# Patient Record
Sex: Female | Born: 1995 | Race: Black or African American | Hispanic: No | Marital: Married | Smoking: Current some day smoker
Health system: Southern US, Community
[De-identification: ages and names within clinical notes are randomized; demographics above are authoritative.]

## PROBLEM LIST (undated history)

## (undated) DIAGNOSIS — J45909 Unspecified asthma, uncomplicated: Secondary | ICD-10-CM

## (undated) DIAGNOSIS — L732 Hidradenitis suppurativa: Secondary | ICD-10-CM

## (undated) DIAGNOSIS — K219 Gastro-esophageal reflux disease without esophagitis: Secondary | ICD-10-CM

## (undated) DIAGNOSIS — I10 Essential (primary) hypertension: Secondary | ICD-10-CM

## (undated) DIAGNOSIS — G43909 Migraine, unspecified, not intractable, without status migrainosus: Secondary | ICD-10-CM

## (undated) HISTORY — DX: Hidradenitis suppurativa: L73.2

## (undated) HISTORY — PX: OTHER SURGICAL HISTORY: SHX169

## (undated) HISTORY — PX: TONSILLECTOMY: SUR1361

---

## 2016-03-14 ENCOUNTER — Encounter (HOSPITAL_COMMUNITY): Payer: Self-pay | Admitting: Family Medicine

## 2016-03-14 ENCOUNTER — Emergency Department (HOSPITAL_COMMUNITY)
Admission: EM | Admit: 2016-03-14 | Discharge: 2016-03-14 | Disposition: A | Discharge status: Home / Self Care | Payer: Medicaid Other | Attending: Emergency Medicine | Admitting: Emergency Medicine

## 2016-03-14 DIAGNOSIS — Z79899 Other long term (current) drug therapy: Secondary | ICD-10-CM | POA: Insufficient documentation

## 2016-03-14 DIAGNOSIS — R112 Nausea with vomiting, unspecified: Secondary | ICD-10-CM | POA: Diagnosis not present

## 2016-03-14 DIAGNOSIS — R1010 Upper abdominal pain, unspecified: Secondary | ICD-10-CM | POA: Diagnosis present

## 2016-03-14 DIAGNOSIS — J45909 Unspecified asthma, uncomplicated: Secondary | ICD-10-CM | POA: Diagnosis not present

## 2016-03-14 HISTORY — DX: Migraine, unspecified, not intractable, without status migrainosus: G43.909

## 2016-03-14 HISTORY — DX: Gastro-esophageal reflux disease without esophagitis: K21.9

## 2016-03-14 HISTORY — DX: Unspecified asthma, uncomplicated: J45.909

## 2016-03-14 LAB — COMPREHENSIVE METABOLIC PANEL
ALBUMIN: 3.9 g/dL (ref 3.5–5.0)
ALK PHOS: 61 U/L (ref 38–126)
ALT: 19 U/L (ref 14–54)
ANION GAP: 7 (ref 5–15)
AST: 22 U/L (ref 15–41)
BILIRUBIN TOTAL: 0.4 mg/dL (ref 0.3–1.2)
BUN: 8 mg/dL (ref 6–20)
CALCIUM: 9.1 mg/dL (ref 8.9–10.3)
CO2: 24 mmol/L (ref 22–32)
Chloride: 106 mmol/L (ref 101–111)
Creatinine, Ser: 0.84 mg/dL (ref 0.44–1.00)
GFR calc non Af Amer: >60 mL/min (ref >60)
GLUCOSE: 99 mg/dL (ref 65–99)
POTASSIUM: 3.9 mmol/L (ref 3.5–5.1)
Sodium: 137 mmol/L (ref 135–145)
TOTAL PROTEIN: 7 g/dL (ref 6.5–8.1)

## 2016-03-14 LAB — CBC
HEMATOCRIT: 42.4 % (ref 36.0–46.0)
HEMOGLOBIN: 14.2 g/dL (ref 12.0–15.0)
MCH: 31.3 pg (ref 26.0–34.0)
MCHC: 33.5 g/dL (ref 30.0–36.0)
MCV: 93.4 fL (ref 78.0–100.0)
Platelets: 254 10*3/uL (ref 150–400)
RBC: 4.54 MIL/uL (ref 3.87–5.11)
RDW: 13.1 % (ref 11.5–15.5)
WBC: 5.8 10*3/uL (ref 4.0–10.5)

## 2016-03-14 LAB — URINALYSIS, ROUTINE W REFLEX MICROSCOPIC
BILIRUBIN URINE: NEGATIVE
Glucose, UA: NEGATIVE mg/dL
HGB URINE DIPSTICK: NEGATIVE
KETONES UR: NEGATIVE mg/dL
NITRITE: NEGATIVE
PH: 5.5 (ref 5.0–8.0)
PROTEIN: NEGATIVE mg/dL
SPECIFIC GRAVITY, URINE: 1.027 (ref 1.005–1.030)

## 2016-03-14 LAB — URINE MICROSCOPIC-ADD ON

## 2016-03-14 LAB — I-STAT BETA HCG BLOOD, ED (MC, WL, AP ONLY)

## 2016-03-14 LAB — LIPASE, BLOOD: Lipase: 33 U/L (ref 11–51)

## 2016-03-14 MED ORDER — ONDANSETRON HCL 4 MG/2ML IJ SOLN
4.0000 mg | Freq: Once | INTRAMUSCULAR | Status: AC
  Administered 2016-03-14: 4 mg via INTRAVENOUS
  Filled 2016-03-14: qty 2

## 2016-03-14 MED ORDER — ONDANSETRON HCL 4 MG/2ML IJ SOLN: 4.0000 mg | Freq: Once | INTRAMUSCULAR | Status: DC

## 2016-03-14 MED ORDER — MORPHINE SULFATE (PF) 4 MG/ML IV SOLN
4.0000 mg | Freq: Once | INTRAVENOUS | Status: AC
  Administered 2016-03-14: 4 mg via INTRAVENOUS
  Filled 2016-03-14: qty 1

## 2016-03-14 MED ORDER — ONDANSETRON HCL 4 MG PO TABS
4.0000 mg | ORAL_TABLET | Freq: Four times a day (QID) | ORAL | Status: DC
Start: 2016-03-14 — End: 2016-07-16

## 2016-03-14 MED ORDER — SODIUM CHLORIDE 0.9 % IV BOLUS (SEPSIS)
1000.0000 mL | Freq: Once | INTRAVENOUS | Status: AC
  Administered 2016-03-14: 1000 mL via INTRAVENOUS

## 2016-03-14 NOTE — ED Provider Notes (Signed)
CSN: [id number]     Arrival date & time 03/14/16  1516 History   First MD Initiated Contact with Patient 03/14/16 1952     Chief Complaint  Patient presents with  . Abdominal Pain     (Consider location/radiation/quality/duration/timing/severity/associated sxs/prior Treatment) HPI Comments: Patient presents to the emergency department with chief complaint of upper abdominal pain. She reports associated nausea, and vomiting. She states that she thinks she ate some bad [place] food yesterday. She states that her pain is crampy.  She denies any fevers or chills. She states that she has some pain that radiates into her chest and throat. There are no other associated symptoms. There are no modifying factors.  The history is provided by the patient. No language interpreter was used.    Past Medical History  Diagnosis Date  . Asthma   . Acid reflux   . Migraines    Past Surgical History  Procedure Laterality Date  . Tonsillectomy     History reviewed. No pertinent family history. Social History  Substance Use Topics  . Smoking status: Never Smoker   . Smokeless tobacco: None  . Alcohol Use: No   OB History    No data available     Review of Systems  Constitutional: Negative for fever and chills.  Respiratory: Negative for shortness of breath.   Cardiovascular: Negative for chest pain.  Gastrointestinal: Positive for nausea and vomiting. Negative for diarrhea and constipation.  Genitourinary: Negative for dysuria.  All other systems reviewed and are negative.     Allergies  Review of patient's allergies indicates no known allergies.  Home Medications   Prior to Admission medications   Medication Sig Start Date End Date Taking? Authorizing Provider  albuterol (PROAIR HFA) 108 (90 Base) MCG/ACT inhaler Inhale 2 puffs into the lungs every 6 (six) hours as needed for wheezing or shortness of breath (FOR EXERCISE-INDUCED ASTHMA).   Yes Historical Provider, MD  naproxen  sodium (ANAPROX) 550 MG tablet Take 550 mg by mouth 2 (two) times daily as needed.    Historical Provider, MD  topiramate (TOPAMAX) 25 MG tablet Take 50 mg by mouth at bedtime.    Historical Provider, MD   BP 121/72 mmHg  Pulse 74  Temp(Src) 98.9 F (37.2 C)  Resp 22  SpO2 99% Physical Exam  Constitutional: She is oriented to person, place, and time. She appears well-developed and well-nourished.  HENT:  Head: Normocephalic and atraumatic.  Eyes: Conjunctivae and EOM are normal. Pupils are equal, round, and reactive to light.  Neck: Normal range of motion. Neck supple.  Cardiovascular: Normal rate and regular rhythm.  Exam reveals no gallop and no friction rub.   No murmur heard. Pulmonary/Chest: Effort normal and breath sounds normal. No respiratory distress. She has no wheezes. She has no rales. She exhibits no tenderness.  Abdominal: Soft. Bowel sounds are normal. She exhibits no distension and no mass. There is no tenderness. There is no rebound and no guarding.  No focal abdominal tenderness, no RLQ tenderness or pain at McBurney's point, no RUQ tenderness or Murphy's sign, no left-sided abdominal tenderness, no fluid wave, or signs of peritonitis   Musculoskeletal: Normal range of motion. She exhibits no edema or tenderness.  Neurological: She is alert and oriented to person, place, and time.  Skin: Skin is warm and dry.  Psychiatric: She has a normal mood and affect. Her behavior is normal. Judgment and thought content normal.  Nursing note and vitals reviewed.   ED Course  Procedures (including critical care time) Results for orders placed or performed during the hospital encounter of 03/14/16  Lipase, blood  Result Value Ref Range   Lipase 33 11 - 51 U/L  Comprehensive metabolic panel  Result Value Ref Range   Sodium 137 135 - 145 mmol/L   Potassium 3.9 3.5 - 5.1 mmol/L   Chloride 106 101 - 111 mmol/L   CO2 24 22 - 32 mmol/L   Glucose, Bld 99 65 - 99 mg/dL   BUN 8 6  - 20 mg/dL   Creatinine, Ser 0.270.84 0.44 - 1.00 mg/dL   Calcium 9.1 8.9 - 25.310.3 mg/dL   Total Protein 7.0 6.5 - 8.1 g/dL   Albumin 3.9 3.5 - 5.0 g/dL   AST 22 15 - 41 U/L   ALT 19 14 - 54 U/L   Alkaline Phosphatase 61 38 - 126 U/L   Total Bilirubin 0.4 0.3 - 1.2 mg/dL   GFR calc non Af Amer >60 >60 mL/min   GFR calc Af Amer >60 >60 mL/min   Anion gap 7 5 - 15  CBC  Result Value Ref Range   WBC 5.8 4.0 - 10.5 K/uL   RBC 4.54 3.87 - 5.11 MIL/uL   Hemoglobin 14.2 12.0 - 15.0 g/dL   HCT 66.442.4 40.336.0 - 47.446.0 %   MCV 93.4 78.0 - 100.0 fL   MCH 31.3 26.0 - 34.0 pg   MCHC 33.5 30.0 - 36.0 g/dL   RDW 25.913.1 56.311.5 - 87.515.5 %   Platelets 254 150 - 400 K/uL  Urinalysis, Routine w reflex microscopic  Result Value Ref Range   Color, Urine YELLOW YELLOW   APPearance CLOUDY (A) CLEAR   Specific Gravity, Urine 1.027 1.005 - 1.030   pH 5.5 5.0 - 8.0   Glucose, UA NEGATIVE NEGATIVE mg/dL   Hgb urine dipstick NEGATIVE NEGATIVE   Bilirubin Urine NEGATIVE NEGATIVE   Ketones, ur NEGATIVE NEGATIVE mg/dL   Protein, ur NEGATIVE NEGATIVE mg/dL   Nitrite NEGATIVE NEGATIVE   Leukocytes, UA SMALL (A) NEGATIVE  Urine microscopic-add on  Result Value Ref Range   Squamous Epithelial / LPF 0-5 (A) NONE SEEN   WBC, UA 0-5 0 - 5 WBC/hpf   RBC / HPF 0-5 0 - 5 RBC/hpf   Bacteria, UA MANY (A) NONE SEEN   Urine-Other MUCOUS PRESENT   I-Stat beta hCG blood, ED  Result Value Ref Range   I-stat hCG, quantitative <5.0 <5 mIU/mL   Comment 3           No results found.  I have personally reviewed and evaluated these images and lab results as part of my medical decision-making.   EKG Interpretation None      MDM   Final diagnoses:  Non-intractable vomiting with nausea, vomiting of unspecified type    Patient with one episode of nausea and vomiting today. She has had some crampy abdominal pain since eating [place] food yesterday. Denies any fevers. There there is no focal abdominal tenderness on exam. Doubt  surgical or acute abdomen. Vital signs are stable. Laboratory workup is unremarkable. Will treat symptoms and plan for discharge to home.  11:06 PM Patient states that she is feeling better. Will discharge to home with Zofran. No focal tenderness on exam. Labs and vitals are reassuring.    Roxy HorsemanRobert Azriel Jakob, PA-C 03/14/16 [id number]  Azalia BilisKevin Campos, MD 03/15/16 0111

## 2016-03-14 NOTE — ED Notes (Signed)
Pt here for upper abd pain across abdomen and N,V since yesterday.

## 2016-03-14 NOTE — Discharge Instructions (Signed)
Nausea and Vomiting °Nausea is a sick feeling that often comes before throwing up (vomiting). Vomiting is a reflex where stomach contents come out of your mouth. Vomiting can cause severe loss of body fluids (dehydration). Children and elderly adults can become dehydrated quickly, especially if they also have diarrhea. Nausea and vomiting are symptoms of a condition or disease. It is important to find the cause of your symptoms. °CAUSES  °· Direct irritation of the stomach lining. This irritation can result from increased acid production (gastroesophageal reflux disease), infection, food poisoning, taking certain medicines (such as nonsteroidal anti-inflammatory drugs), alcohol use, or tobacco use. °· Signals from the brain. These signals could be caused by a headache, heat exposure, an inner ear disturbance, increased pressure in the brain from injury, infection, a tumor, or a concussion, pain, emotional stimulus, or metabolic problems. °· An obstruction in the gastrointestinal tract (bowel obstruction). °· Illnesses such as diabetes, hepatitis, gallbladder problems, appendicitis, kidney problems, cancer, sepsis, atypical symptoms of a heart attack, or eating disorders. °· Medical treatments such as chemotherapy and radiation. °· Receiving medicine that makes you sleep (general anesthetic) during surgery. °DIAGNOSIS °Your caregiver may ask for tests to be done if the problems do not improve after a few days. Tests may also be done if symptoms are severe or if the reason for the nausea and vomiting is not clear. Tests may include: °· Urine tests. °· Blood tests. °· Stool tests. °· Cultures (to look for evidence of infection). °· X-rays or other imaging studies. °Test results can help your caregiver make decisions about treatment or the need for additional tests. °TREATMENT °You need to stay well hydrated. Drink frequently but in small amounts. You may wish to drink water, sports drinks, clear broth, or eat frozen  ice pops or gelatin dessert to help stay hydrated. When you eat, eating slowly may help prevent nausea. There are also some antinausea medicines that may help prevent nausea. °HOME CARE INSTRUCTIONS  °· Take all medicine as directed by your caregiver. °· If you do not have an appetite, do not force yourself to eat. However, you must continue to drink fluids. °· If you have an appetite, eat a normal diet unless your caregiver tells you differently. °· Eat a variety of complex carbohydrates (rice, wheat, potatoes, bread), lean meats, yogurt, fruits, and vegetables. °· Avoid high-fat foods because they are more difficult to digest. °· Drink enough water and fluids to keep your urine clear or pale yellow. °· If you are dehydrated, ask your caregiver for specific rehydration instructions. Signs of dehydration may include: °· Severe thirst. °· Dry lips and mouth. °· Dizziness. °· Dark urine. °· Decreasing urine frequency and amount. °· Confusion. °· Rapid breathing or pulse. °SEEK IMMEDIATE MEDICAL CARE IF:  °· You have blood or brown flecks (like coffee grounds) in your vomit. °· You have black or bloody stools. °· You have a severe headache or stiff neck. °· You are confused. °· You have severe abdominal pain. °· You have chest pain or trouble breathing. °· You do not urinate at least once every 8 hours. °· You develop cold or clammy skin. °· You continue to vomit for longer than 24 to 48 hours. °· You have a fever. °MAKE SURE YOU:  °· Understand these instructions. °· Will watch your condition. °· Will get help right away if you are not doing well or get worse. °  °This information is not intended to replace advice given to you by your health care provider. Make sure   you discuss any questions you have with your health care provider. °  °Document Released: 09/09/2005 Document Revised: 12/02/2011 Document Reviewed: 02/06/2011 °Elsevier Interactive Patient Education ©2016 [employer] ° °Abdominal Pain, Adult °Many  things can cause abdominal pain. Usually, abdominal pain is not caused by a disease and will improve without treatment. It can often be observed and treated at home. Your health care provider will do a physical exam and possibly order blood tests and X-rays to help determine the seriousness of your pain. However, in many cases, more time must pass before a clear cause of the pain can be found. Before that point, your health care provider may not know if you need more testing or further treatment. °HOME CARE INSTRUCTIONS °Monitor your abdominal pain for any changes. The following actions may help to alleviate any discomfort you are experiencing: °· Only take over-the-counter or prescription medicines as directed by your health care provider. °· Do not take laxatives unless directed to do so by your health care provider. °· Try a clear liquid diet (broth, tea, or water) as directed by your health care provider. Slowly move to a bland diet as tolerated. °SEEK MEDICAL CARE IF: °· You have unexplained abdominal pain. °· You have abdominal pain associated with nausea or diarrhea. °· You have pain when you urinate or have a bowel movement. °· You experience abdominal pain that wakes you in the night. °· You have abdominal pain that is worsened or improved by eating food. °· You have abdominal pain that is worsened with eating fatty foods. °· You have a fever. °SEEK IMMEDIATE MEDICAL CARE IF: °· Your pain does not go away within 2 hours. °· You keep throwing up (vomiting). °· Your pain is felt only in portions of the abdomen, such as the right side or the left lower portion of the abdomen. °· You pass bloody or black tarry stools. °MAKE SURE YOU: °· Understand these instructions. °· Will watch your condition. °· Will get help right away if you are not doing well or get worse. °  °This information is not intended to replace advice given to you by your health care provider. Make sure you discuss any questions you have with  your health care provider. °  °Document Released: 06/19/2005 Document Revised: 05/31/2015 Document Reviewed: 05/19/2013 °Elsevier Interactive Patient Education ©2016 [employer]vier Inc. ° °

## 2016-05-30 ENCOUNTER — Encounter (HOSPITAL_COMMUNITY): Payer: Self-pay | Admitting: Emergency Medicine

## 2016-05-30 DIAGNOSIS — R600 Localized edema: Secondary | ICD-10-CM | POA: Insufficient documentation

## 2016-05-30 DIAGNOSIS — Z7951 Long term (current) use of inhaled steroids: Secondary | ICD-10-CM | POA: Diagnosis not present

## 2016-05-30 DIAGNOSIS — F17200 Nicotine dependence, unspecified, uncomplicated: Secondary | ICD-10-CM | POA: Diagnosis not present

## 2016-05-30 DIAGNOSIS — Z79899 Other long term (current) drug therapy: Secondary | ICD-10-CM | POA: Diagnosis not present

## 2016-05-30 DIAGNOSIS — J45909 Unspecified asthma, uncomplicated: Secondary | ICD-10-CM | POA: Diagnosis not present

## 2016-05-30 DIAGNOSIS — R2242 Localized swelling, mass and lump, left lower limb: Secondary | ICD-10-CM | POA: Diagnosis present

## 2016-05-30 NOTE — ED Triage Notes (Signed)
Pt states for the past week or so she has been having swelling in her lower legs from the knees down  Pt [facility] and was [facility] for further evaluation  Pt states he legs ache but the pain increases with ambulation

## 2016-05-31 ENCOUNTER — Emergency Department (HOSPITAL_COMMUNITY)
Admission: EM | Admit: 2016-05-31 | Discharge: 2016-05-31 | Disposition: A | Discharge status: Home / Self Care | Payer: Medicaid Other | Attending: Emergency Medicine | Admitting: Emergency Medicine

## 2016-05-31 DIAGNOSIS — R600 Localized edema: Secondary | ICD-10-CM

## 2016-05-31 LAB — I-STAT CHEM 8, ED
BUN: 12 mg/dL (ref 6–20)
CALCIUM ION: 1.19 mmol/L (ref 1.15–1.40)
CHLORIDE: 102 mmol/L (ref 101–111)
Creatinine, Ser: 1 mg/dL (ref 0.44–1.00)
Glucose, Bld: 95 mg/dL (ref 65–99)
HCT: 42 % (ref 36.0–46.0)
HEMOGLOBIN: 14.3 g/dL (ref 12.0–15.0)
POTASSIUM: 3.8 mmol/L (ref 3.5–5.1)
SODIUM: 141 mmol/L (ref 135–145)
TCO2: 26 mmol/L (ref 0–100)

## 2016-05-31 LAB — I-STAT BETA HCG BLOOD, ED (MC, WL, AP ONLY)

## 2016-05-31 NOTE — Discharge Instructions (Signed)
Elevate your legs above your heart!  Take 4 over the counter ibuprofen tablets 3 times a day or 2 over-the-counter naproxen tablets twice a day for pain. Also take tylenol 1000mg (2 extra strength) four times a day.

## 2016-05-31 NOTE — ED Provider Notes (Signed)
[facility]-EMERGENCY DEPT Provider Note   CSN: [id number] Arrival date & time: 05/30/16  2009 By signing my name below, I, Levon Hedger, attest that this documentation has been prepared under the direction and in the presence of Melene Plan, DO . Electronically Signed: Levon Hedger, Scribe. 05/31/2016. 1:15 AM.   History   Chief Complaint Chief Complaint  Patient presents with  . Leg Swelling    HPI Frances Jones is a 20 y.o. female who presents to the Emergency Department complaining of moderate bilateral leg swelling onset one week ago. She also notes associated aching leg pain. Per pt, pain is exacerbated by ambulation. 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 Pt is otherwise in her normal health and denies any recent illness or injury. She denies any hx of kidney or liver problems.  Pt denies SOB or difficulties laying flat.   The history is provided by the patient. No language interpreter was used.    Past Medical History:  Diagnosis Date  . Acid reflux   . Asthma   . Migraines     There are no active problems to display for this patient.   Past Surgical History:  Procedure Laterality Date  . TONSILLECTOMY      OB History    No data available       Home Medications    Prior to Admission medications   Medication Sig Start Date End Date Taking? Authorizing Provider  albuterol (PROAIR HFA) 108 (90 Base) MCG/ACT inhaler Inhale 2 puffs into the lungs every 6 (six) hours as needed for wheezing or shortness of breath (FOR EXERCISE-INDUCED ASTHMA).    Historical Provider, MD  naproxen sodium (ANAPROX) 550 MG tablet Take 550 mg by mouth 2 (two) times daily as needed.    Historical Provider, MD  ondansetron (ZOFRAN) 4 MG tablet Take 1 tablet (4 mg total) by mouth every 6 (six) hours. 03/14/16   Roxy Horseman, PA-C  topiramate (TOPAMAX) 25 MG tablet Take 50 mg by mouth at bedtime.     Historical Provider, MD    Family History Family History  Problem Relation Age of Onset  . Diabetes Mother   . Cancer Other   . Stroke Other     Social History Social History  Substance Use Topics  . Smoking status: Current Some Day Smoker  . Smokeless tobacco: Never Used  . Alcohol use Yes     Allergies   Review of patient's allergies indicates no known allergies.   Review of Systems Review of Systems  Constitutional: Negative for chills and fever.  HENT: Negative for congestion and rhinorrhea.   Eyes: Negative for redness and visual disturbance.  Respiratory: Negative for shortness of breath and wheezing.   Cardiovascular: Positive for leg swelling. Negative for chest pain and palpitations.  Gastrointestinal: Negative for nausea and vomiting.  Genitourinary: Negative for dysuria and urgency.  Musculoskeletal: Positive for myalgias. Negative for arthralgias.  Skin: Negative for pallor and wound.  Neurological: Negative for dizziness and headaches.    Physical Exam Updated Vital Signs BP 124/55 (BP Location: Left Arm)   Pulse 72   Temp 98.3 F (36.8 C) (Oral)   Resp 15   SpO2 99%   Physical Exam  Constitutional: She is oriented to person, place, and time. She appears well-developed and well-nourished. No distress.  HENT:  Head: Normocephalic and atraumatic.  Eyes: EOM are normal. Pupils are equal, round, and  reactive to light.  Neck: Normal range of motion. Neck supple.  Cardiovascular: Normal rate, regular rhythm and normal heart sounds.  Exam reveals no gallop and no friction rub.   No murmur heard. Pulmonary/Chest: Effort normal. She has no wheezes. She has no rales.  Abdominal: Soft. She exhibits no distension. There is no tenderness.  Musculoskeletal: She exhibits no edema or tenderness.  +1 pitting edema up to the knees bilaterally  Neurological: She is alert and oriented to person, place, and time.  Skin: Skin is warm and dry. She is not diaphoretic.   Psychiatric: She has a normal mood and affect. Her behavior is normal.  Nursing note and vitals reviewed.  ED Treatments / Results  DIAGNOSTIC STUDIES:  Oxygen Saturation is 99% on RA, normal by my interpretation.    COORDINATION OF CARE:  1:13 AM Discussed treatment plan with pt at bedside and pt agreed to plan.   Labs (all labs ordered are listed, but only abnormal results are displayed) Labs Reviewed  I-STAT CHEM 8, ED  I-STAT BETA HCG BLOOD, ED ([facility], [facility], [facility] ONLY)    EKG  EKG Interpretation None       Radiology No results found.  Procedures Procedures (including critical care time)  Medications Ordered in ED Medications - No data to display   Initial Impression / Assessment and Plan / ED Course  I have reviewed the triage vital signs and the nursing notes.  Pertinent labs & imaging results that were available during my care of the patient were reviewed by me and considered in my medical decision making (see chart for details).  Clinical Course    20 yo F With a chief complaint of bilateral leg swelling. Going on for the past week or so. Patient has just started clinicals and so she's been on her feet more often than normal. Recommended leg elevation. PCP follow-up.  1:32 AM:  I have discussed the diagnosis/risks/treatment options with the patient and family and believe the pt to be eligible for discharge home to follow-up with PCP. We also discussed returning to the ED immediately if new or worsening sx occur. We discussed the sx which are most concerning (e.g., sudden worsening swelling, sob, syncope) that necessitate immediate return. Medications administered to the patient during their visit and any new prescriptions provided to the patient are listed below.  Medications given during this visit Medications - No data to display   The patient appears reasonably screen and/or stabilized for discharge and I doubt any other medical condition or other [facility]  requiring further screening, evaluation, or treatment in the ED at this time prior to discharge.    Final Clinical Impressions(s) / ED Diagnoses   Final diagnoses:  Bilateral edema of lower extremity   I personally performed the services described in this documentation, which was scribed in my presence. The recorded information has been reviewed and is accurate.    New Prescriptions New Prescriptions   No medications on file     Melene PlanDan Arabia Nylund, DO 05/31/16 0132

## 2016-07-05 ENCOUNTER — Encounter (HOSPITAL_COMMUNITY): Payer: Self-pay | Admitting: Emergency Medicine

## 2016-07-05 ENCOUNTER — Ambulatory Visit (HOSPITAL_COMMUNITY)
Admission: EM | Admit: 2016-07-05 | Discharge: 2016-07-05 | Disposition: A | Discharge status: Home / Self Care | Payer: Medicaid Other | Attending: Emergency Medicine | Admitting: Emergency Medicine

## 2016-07-05 DIAGNOSIS — N938 Other specified abnormal uterine and vaginal bleeding: Secondary | ICD-10-CM | POA: Diagnosis not present

## 2016-07-05 DIAGNOSIS — R600 Localized edema: Secondary | ICD-10-CM

## 2016-07-05 DIAGNOSIS — M791 Myalgia, unspecified site: Secondary | ICD-10-CM

## 2016-07-05 LAB — POCT URINALYSIS DIP (DEVICE)
BILIRUBIN URINE: NEGATIVE
GLUCOSE, UA: NEGATIVE mg/dL
KETONES UR: NEGATIVE mg/dL
LEUKOCYTES UA: NEGATIVE
Nitrite: NEGATIVE
Protein, ur: NEGATIVE mg/dL
Urobilinogen, UA: 0.2 mg/dL (ref 0.0–1.0)
pH: 6 (ref 5.0–8.0)

## 2016-07-05 LAB — POCT I-STAT, CHEM 8
BUN: 6 mg/dL (ref 6–20)
CHLORIDE: 105 mmol/L (ref 101–111)
Calcium, Ion: 1.2 mmol/L (ref 1.15–1.40)
Creatinine, Ser: 1 mg/dL (ref 0.44–1.00)
GLUCOSE: 92 mg/dL (ref 65–99)
HEMATOCRIT: 41 % (ref 36.0–46.0)
Hemoglobin: 13.9 g/dL (ref 12.0–15.0)
POTASSIUM: 4 mmol/L (ref 3.5–5.1)
SODIUM: 141 mmol/L (ref 135–145)
TCO2: 25 mmol/L (ref 0–100)

## 2016-07-05 LAB — POCT PREGNANCY, URINE: PREG TEST UR: NEGATIVE

## 2016-07-05 NOTE — Discharge Instructions (Signed)
Your labs were normal. Please follow up with PCP regarding peripheral edema of legs/dysfunctional uterine bleeding, student health or obtain local PCP. Wearing compression hose/stockings will help with symptoms, elevating lower extremities will help, reduce salt in diet. Go to ER for new or worsening symptoms. Return to UC as needed.

## 2016-07-05 NOTE — ED Triage Notes (Signed)
Pt here for bilateral pedal edema onset 1 month +++   Reports she's been to PCP, [facility], and [facility] for similar sx  Sx today include: pain and swelling... Increases w/activity  Reports she was exercising before coming here  A&O x4... NAD

## 2016-07-05 NOTE — ED Provider Notes (Signed)
CSN: [id number]     Arrival date & time 07/05/16  1518 History   None    Chief Complaint  Patient presents with  . Leg Swelling   (Consider location/radiation/quality/duration/timing/severity/associated sxs/prior Treatment) 20 yr old AA female presents to Er with CC of bilateral leg swellingx >1 year, has been seen by PCP, ER, Orthopedist for same advised peripheral edema and follow up with PCP. Pt staets she is in school and PCP is 2 hours away. Pt also reports myalgia of legs and irregular vaginal bleeding since pedal edema and implant hormone therapy/Birth control. Pt denies any trauma or injury. No treatmetn PTA. Pt is nursing student on feet a lot.    The history is provided by the patient.    Past Medical History:  Diagnosis Date  . Acid reflux   . Asthma   . Migraines    Past Surgical History:  Procedure Laterality Date  . TONSILLECTOMY     Family History  Problem Relation Age of Onset  . Diabetes Mother   . Cancer Other   . Stroke Other    Social History  Substance Use Topics  . Smoking status: Current Some Day Smoker  . Smokeless tobacco: Never Used  . Alcohol use Yes   OB History    No data available     Review of Systems  Constitutional: Negative for fever.  HENT: Negative.   Eyes: Negative.   Respiratory: Negative.  Negative for chest tightness and shortness of breath.   Cardiovascular: Positive for leg swelling. Negative for chest pain and palpitations.  Gastrointestinal: Negative.   Endocrine: Negative.   Genitourinary: Negative.   Musculoskeletal: Positive for myalgias.  Skin: Negative for color change, pallor, rash and wound.  Allergic/Immunologic: Negative.   Neurological: Negative.   Hematological: Negative.   Psychiatric/Behavioral: Negative.   All other systems reviewed and are negative.   Allergies  Review of patient's allergies indicates no known allergies.  Home Medications   Prior to Admission medications   Medication Sig Start  Date End Date Taking? Authorizing Provider  albuterol (PROAIR HFA) 108 (90 Base) MCG/ACT inhaler Inhale 2 puffs into the lungs every 6 (six) hours as needed for wheezing or shortness of breath (FOR EXERCISE-INDUCED ASTHMA).    Historical Provider, MD  naproxen sodium (ANAPROX) 550 MG tablet Take 550 mg by mouth 2 (two) times daily as needed.    Historical Provider, MD  ondansetron (ZOFRAN) 4 MG tablet Take 1 tablet (4 mg total) by mouth every 6 (six) hours. 03/14/16   Roxy Horseman, PA-C  topiramate (TOPAMAX) 25 MG tablet Take 50 mg by mouth at bedtime.    Historical Provider, MD   Meds Ordered and Administered this Visit  Medications - No data to display  BP 118/67 (BP Location: Left Arm)   Pulse (!) 55 Comment: notified rn  Temp 98.8 F (37.1 C) (Oral)   Resp 14   SpO2 100%  No data found.   Physical Exam  Constitutional: She is oriented to person, place, and time. She appears well-developed and well-nourished. She is active and cooperative.  Non-toxic appearance. She does not have a sickly appearance. She does not appear ill. No distress.  HENT:  Head: Normocephalic.  Eyes: Pupils are equal, round, and reactive to light.  Cardiovascular: Normal rate, regular rhythm and normal pulses.   Pulses:      Dorsalis pedis pulses are 2+ on the right side, and 2+ on the left side.  Pulmonary/Chest: Effort normal.  Abdominal: Normal  appearance.  Obese abdomen  Musculoskeletal: Normal range of motion. She exhibits edema and tenderness.  Bilateral +2 pedal edema R>L, + TTP of lower legs, good distal sensation and movement  Neurological: She is alert and oriented to person, place, and time. She has normal strength. No cranial nerve deficit. Gait normal. GCS eye subscore is 4. GCS verbal subscore is 5. GCS motor subscore is 6.  Skin: Skin is warm, dry and intact. Capillary refill takes 2 to 3 seconds. No rash noted.  Psychiatric: She has a normal mood and affect. Her speech is normal and  behavior is normal. Judgment and thought content normal.  Nursing note and vitals reviewed.   Urgent Care Course   Clinical Course    Procedures (including critical care time)  Labs Review Labs Reviewed  POCT URINALYSIS DIP (DEVICE) - Abnormal; Notable for the following:       Result Value   Hgb urine dipstick MODERATE (*)    All other components within normal limits  POCT I-STAT, CHEM 8  POCT PREGNANCY, URINE    Imaging Review No results found.   Recent Results (from the past 2160 hour(s))  I-Stat Beta hCG blood, ED ([facility], [facility], [facility] only)     Status: None   Collection Time: 05/31/16 12:56 AM  Result Value Ref Range   I-stat hCG, quantitative <5.0 <5 mIU/mL   Comment 3            Comment:   GEST. AGE      CONC.  (mIU/mL)   <=1 WEEK        5 - 50     2 WEEKS       50 - 500     3 WEEKS       100 - 10,000     4 WEEKS     1,000 - 30,000        FEMALE AND NON-PREGNANT FEMALE:     LESS THAN 5 mIU/mL   I-Stat Chem 8, ED     Status: None   Collection Time: 05/31/16 12:58 AM  Result Value Ref Range   Sodium 141 135 - 145 mmol/L   Potassium 3.8 3.5 - 5.1 mmol/L   Chloride 102 101 - 111 mmol/L   BUN 12 6 - 20 mg/dL   Creatinine, Ser 1.611.00 0.44 - 1.00 mg/dL   Glucose, Bld 95 65 - 99 mg/dL   Calcium, Ion 0.961.19 0.451.15 - 1.40 mmol/L   TCO2 26 0 - 100 mmol/L   Hemoglobin 14.3 12.0 - 15.0 g/dL   HCT 40.942.0 81.136.0 - 91.446.0 %  POCT urinalysis dip (device)     Status: Abnormal   Collection Time: 07/05/16  5:32 PM  Result Value Ref Range   Glucose, UA NEGATIVE NEGATIVE mg/dL   Bilirubin Urine NEGATIVE NEGATIVE   Ketones, ur NEGATIVE NEGATIVE mg/dL   Specific Gravity, Urine <=1.005 1.005 - 1.030   Hgb urine dipstick MODERATE (A) NEGATIVE   pH 6.0 5.0 - 8.0   Protein, ur NEGATIVE NEGATIVE mg/dL   Urobilinogen, UA 0.2 0.0 - 1.0 mg/dL   Nitrite NEGATIVE NEGATIVE   Leukocytes, UA NEGATIVE NEGATIVE    Comment: Biochemical Testing Only. Please order routine urinalysis from main lab if  confirmatory testing is needed.  I-STAT, chem 8     Status: None   Collection Time: 07/05/16  5:37 PM  Result Value Ref Range   Sodium 141 135 - 145 mmol/L   Potassium 4.0 3.5 - 5.1 mmol/L  Chloride 105 101 - 111 mmol/L   BUN 6 6 - 20 mg/dL   Creatinine, Ser 1.61 0.44 - 1.00 mg/dL   Glucose, Bld 92 65 - 99 mg/dL   Calcium, Ion 0.96 1.15 - 1.40 mmol/L   TCO2 25 0 - 100 mmol/L   Hemoglobin 13.9 12.0 - 15.0 g/dL   HCT 04.5 40.9 - 81.1 %  Pregnancy, urine POC     Status: None   Collection Time: 07/05/16  5:39 PM  Result Value Ref Range   Preg Test, Ur NEGATIVE NEGATIVE    Comment:        THE SENSITIVITY OF THIS METHODOLOGY IS >24 mIU/mL        MDM   1. Bilateral lower extremity edema   2. Dysfunctional uterine bleeding   3. Myalgia    I stat, pregnancy, UA in UC.  Reviewed labs: Your labs were normal. Please follow up with PCP regarding peripheral edema of legs, student health or obtain local PCP. Wearing compression hose/stockings will help with symptoms, elevating lower extremities will help, reduce salt in diet. Go to ER for new or worsening symptoms. Return to UC as needed. Pt verbalized understanding to this provider.    Clancy Gourd, NP 07/05/16 [phone]

## 2016-07-15 ENCOUNTER — Encounter (HOSPITAL_COMMUNITY): Payer: Self-pay | Admitting: Emergency Medicine

## 2016-07-15 ENCOUNTER — Emergency Department (HOSPITAL_COMMUNITY): Payer: Medicaid Other

## 2016-07-15 ENCOUNTER — Other Ambulatory Visit: Payer: Self-pay

## 2016-07-15 ENCOUNTER — Emergency Department (HOSPITAL_COMMUNITY)
Admission: EM | Admit: 2016-07-15 | Discharge: 2016-07-16 | Disposition: A | Discharge status: Home / Self Care | Payer: Medicaid Other | Attending: Emergency Medicine | Admitting: Emergency Medicine

## 2016-07-15 DIAGNOSIS — R0789 Other chest pain: Secondary | ICD-10-CM | POA: Insufficient documentation

## 2016-07-15 DIAGNOSIS — J45909 Unspecified asthma, uncomplicated: Secondary | ICD-10-CM | POA: Insufficient documentation

## 2016-07-15 DIAGNOSIS — F17200 Nicotine dependence, unspecified, uncomplicated: Secondary | ICD-10-CM | POA: Insufficient documentation

## 2016-07-15 LAB — CBC
HCT: 42 % (ref 36.0–46.0)
HEMOGLOBIN: 13.8 g/dL (ref 12.0–15.0)
MCH: 31.1 pg (ref 26.0–34.0)
MCHC: 32.9 g/dL (ref 30.0–36.0)
MCV: 94.6 fL (ref 78.0–100.0)
Platelets: 305 10*3/uL (ref 150–400)
RBC: 4.44 MIL/uL (ref 3.87–5.11)
RDW: 12.8 % (ref 11.5–15.5)
WBC: 9.3 10*3/uL (ref 4.0–10.5)

## 2016-07-15 LAB — BASIC METABOLIC PANEL
ANION GAP: 6 (ref 5–15)
BUN: 7 mg/dL (ref 6–20)
CALCIUM: 9.1 mg/dL (ref 8.9–10.3)
CHLORIDE: 110 mmol/L (ref 101–111)
CO2: 25 mmol/L (ref 22–32)
Creatinine, Ser: 0.94 mg/dL (ref 0.44–1.00)
GFR calc non Af Amer: >60 mL/min (ref >60)
Glucose, Bld: 117 mg/dL — ABNORMAL HIGH (ref 65–99)
Potassium: 4.3 mmol/L (ref 3.5–5.1)
Sodium: 141 mmol/L (ref 135–145)

## 2016-07-15 LAB — I-STAT TROPONIN, ED: TROPONIN I, POC: 0 ng/mL (ref 0.00–0.08)

## 2016-07-15 NOTE — ED Triage Notes (Signed)
Patient reports pain across her chest onset this evening with occasional dry cough , denies SOB , no nausea or diaphoresis .

## 2016-07-16 MED ORDER — ALBUTEROL SULFATE (2.5 MG/3ML) 0.083% IN NEBU
5.0000 mg | INHALATION_SOLUTION | Freq: Once | RESPIRATORY_TRACT | Status: AC
  Administered 2016-07-16: 5 mg via RESPIRATORY_TRACT
  Filled 2016-07-16: qty 6

## 2016-07-16 MED ORDER — NAPROXEN 500 MG PO TABS: 500.0000 mg | ORAL_TABLET | Freq: Two times a day (BID) | ORAL | 0 refills | Status: DC

## 2016-07-16 MED ORDER — IPRATROPIUM BROMIDE 0.02 % IN SOLN
0.5000 mg | Freq: Once | RESPIRATORY_TRACT | Status: AC
  Administered 2016-07-16: 0.5 mg via RESPIRATORY_TRACT
  Filled 2016-07-16: qty 2.5

## 2016-07-16 NOTE — ED Provider Notes (Signed)
[facility]-EMERGENCY DEPT Provider Note   CSN: [id number] Arrival date & time: 07/15/16  2138     History   Chief Complaint Chief Complaint  Patient presents with  . Chest Pain    HPI Frances Jones is a 20 y.o. female.  Patient with a history of mild asthma presents with chest pain across mid-chest that started earlier this evening. She describes the pain as pressure. No SOB, nausea, vomiting, fever or cough. No URI symptoms. She does not feel this is secondary to asthma. No injury to chest wall. No alleviating or aggravating factors.    The history is provided by the patient. No language interpreter was used.    Past Medical History:  Diagnosis Date  . Acid reflux   . Asthma   . Migraines     There are no active problems to display for this patient.   Past Surgical History:  Procedure Laterality Date  . TONSILLECTOMY      OB History    No data available       Home Medications    Prior to Admission medications   Medication Sig Start Date End Date Taking? Authorizing Provider  albuterol (PROAIR HFA) 108 (90 Base) MCG/ACT inhaler Inhale 2 puffs into the lungs every 6 (six) hours as needed for wheezing or shortness of breath (FOR EXERCISE-INDUCED ASTHMA).   Yes Historical Provider, MD  ondansetron (ZOFRAN) 4 MG tablet Take 1 tablet (4 mg total) by mouth every 6 (six) hours. Patient not taking: Reported on 07/15/2016 03/14/16   Roxy Horsemanobert Browning, PA-C    Family History Family History  Problem Relation Age of Onset  . Diabetes Mother   . Cancer Other   . Stroke Other     Social History Social History  Substance Use Topics  . Smoking status: Current Some Day Smoker  . Smokeless tobacco: Never Used  . Alcohol use Yes     Allergies   Review of patient's allergies indicates no known allergies.   Review of Systems Review of Systems  Constitutional: Negative for chills and fever.  HENT: Negative.  Negative for congestion and sore throat.   Respiratory:  Negative.  Negative for cough and shortness of breath.   Cardiovascular: Positive for chest pain.  Gastrointestinal: Negative.  Negative for abdominal pain, nausea and vomiting.  Genitourinary: Negative.  Negative for dysuria.  Musculoskeletal: Negative.  Negative for myalgias.  Skin: Negative.   Neurological: Negative.  Negative for light-headedness.     Physical Exam Updated Vital Signs BP 148/97 (BP Location: Right Arm)   Pulse 99   Temp 98.2 F (36.8 C) (Oral)   Resp 22   Ht 5\' 7"  (1.702 m)   Wt 111.1 kg   SpO2 100%   BMI 38.37 kg/m   Physical Exam  Constitutional: She appears well-developed and well-nourished.  HENT:  Head: Normocephalic.  Neck: Normal range of motion. Neck supple.  Cardiovascular: Normal rate and regular rhythm.   No murmur heard. Pulmonary/Chest: Effort normal and breath sounds normal. She has no wheezes. She has no rales. She exhibits tenderness (Central chest reproducibly tender. ).  Abdominal: Soft. Bowel sounds are normal. There is no tenderness. There is no rebound and no guarding.  Musculoskeletal: Normal range of motion. She exhibits no edema.  Neurological: She is alert.  Skin: Skin is warm and dry. No rash noted.  Psychiatric: She has a normal mood and affect.     ED Treatments / Results  Labs (all labs ordered are listed, but only  abnormal results are displayed) Labs Reviewed  BASIC METABOLIC PANEL - Abnormal; Notable for the following:       Result Value   Glucose, Bld 117 (*)    All other components within normal limits  CBC  I-STAT TROPOININ, ED   Results for orders placed or performed during the hospital encounter of 07/15/16  Basic metabolic panel  Result Value Ref Range   Sodium 141 135 - 145 mmol/L   Potassium 4.3 3.5 - 5.1 mmol/L   Chloride 110 101 - 111 mmol/L   CO2 25 22 - 32 mmol/L   Glucose, Bld 117 (H) 65 - 99 mg/dL   BUN 7 6 - 20 mg/dL   Creatinine, Ser 1.61 0.44 - 1.00 mg/dL   Calcium 9.1 8.9 - 09.6 mg/dL    GFR calc non Af Amer >60 >60 mL/min   GFR calc Af Amer >60 >60 mL/min   Anion gap 6 5 - 15  CBC  Result Value Ref Range   WBC 9.3 4.0 - 10.5 K/uL   RBC 4.44 3.87 - 5.11 MIL/uL   Hemoglobin 13.8 12.0 - 15.0 g/dL   HCT 04.5 40.9 - 81.1 %   MCV 94.6 78.0 - 100.0 fL   MCH 31.1 26.0 - 34.0 pg   MCHC 32.9 30.0 - 36.0 g/dL   RDW 91.4 78.2 - 95.6 %   Platelets 305 150 - 400 K/uL  I-stat troponin, ED  Result Value Ref Range   Troponin i, poc 0.00 0.00 - 0.08 ng/mL   Comment 3            EKG  EKG Interpretation  Date/Time:  Monday July 15 2016 21:42:19 EDT Ventricular Rate:  86 PR Interval:  152 QRS Duration: 86 QT Interval:  360 QTC Calculation: 430 R Axis:   30 Text Interpretation:  Normal sinus rhythm with sinus arrhythmia Normal ECG No old tracing to compare Confirmed by [facility]  MD, DAVID ([id number]) on 07/16/2016 12:23:27 AM       Radiology Dg Chest 2 View  Result Date: 07/15/2016 CLINICAL DATA:  Severe central chest pain 30 minutes ago while eating. Dyspnea. Current smoker. EXAM: CHEST  2 VIEW COMPARISON:  None. FINDINGS: The heart size and mediastinal contours are within normal limits. Mild bilateral interstitial prominence which may reflect bronchitic change. The visualized skeletal structures are unremarkable. IMPRESSION: Mild increase in interstitial prominence which may reflect bronchitic change. Electronically Signed   By: Tollie Eth M.D.   On: 07/15/2016 23:06    Procedures Procedures (including critical care time)  Medications Ordered in ED Medications - No data to display   Initial Impression / Assessment and Plan / ED Course  I have reviewed the triage vital signs and the nursing notes.  Pertinent labs & imaging results that were available during my care of the patient were reviewed by me and considered in my medical decision making (see chart for details).  Clinical Course    Patient with a history of asthma presents with chest pain, unlike her asthma  flares. No fever. Chest reproducibly tender. CXR shows possible bronchitic changes. Will try a nebulizer treatment for symptomatic relief and re-evaluate.  She feels minimally improved after treatment. Pain is reproducible, better with certain positions. Labs negative for evidence of infection, cardiac origin. She can be discharged home with treatment for chest wall pain and follow up with PCP.  Final Clinical Impressions(s) / ED Diagnoses   Final diagnoses:  None   1. Chest wall pain  New Prescriptions New Prescriptions   No medications on file     Elpidio Anis, Cordelia Poche 07/16/16 0243    Dione Booze, MD 07/16/16 [phone]

## 2017-01-22 ENCOUNTER — Emergency Department (HOSPITAL_COMMUNITY)
Admission: EM | Admit: 2017-01-22 | Discharge: 2017-01-22 | Disposition: A | Discharge status: Home / Self Care | Payer: BLUE CROSS/BLUE SHIELD | Attending: Emergency Medicine | Admitting: Emergency Medicine

## 2017-01-22 ENCOUNTER — Emergency Department (HOSPITAL_COMMUNITY): Payer: BLUE CROSS/BLUE SHIELD

## 2017-01-22 ENCOUNTER — Encounter (HOSPITAL_COMMUNITY): Payer: Self-pay | Admitting: *Deleted

## 2017-01-22 DIAGNOSIS — Z79899 Other long term (current) drug therapy: Secondary | ICD-10-CM | POA: Diagnosis not present

## 2017-01-22 DIAGNOSIS — J45909 Unspecified asthma, uncomplicated: Secondary | ICD-10-CM | POA: Insufficient documentation

## 2017-01-22 DIAGNOSIS — F17200 Nicotine dependence, unspecified, uncomplicated: Secondary | ICD-10-CM | POA: Insufficient documentation

## 2017-01-22 DIAGNOSIS — M546 Pain in thoracic spine: Secondary | ICD-10-CM | POA: Diagnosis not present

## 2017-01-22 DIAGNOSIS — M545 Low back pain, unspecified: Secondary | ICD-10-CM

## 2017-01-22 LAB — URINALYSIS, ROUTINE W REFLEX MICROSCOPIC
BILIRUBIN URINE: NEGATIVE
GLUCOSE, UA: NEGATIVE mg/dL
Hgb urine dipstick: NEGATIVE
KETONES UR: NEGATIVE mg/dL
LEUKOCYTES UA: NEGATIVE
Nitrite: NEGATIVE
PROTEIN: NEGATIVE mg/dL
Specific Gravity, Urine: 1.017 (ref 1.005–1.030)
pH: 5 (ref 5.0–8.0)

## 2017-01-22 LAB — POC URINE PREG, ED: Preg Test, Ur: NEGATIVE

## 2017-01-22 MED ORDER — CYCLOBENZAPRINE HCL 10 MG PO TABS: 10.0000 mg | ORAL_TABLET | Freq: Three times a day (TID) | ORAL | 0 refills | Status: AC | PRN

## 2017-01-22 MED ORDER — KETOROLAC TROMETHAMINE 60 MG/2ML IM SOLN
30.0000 mg | Freq: Once | INTRAMUSCULAR | Status: AC
  Administered 2017-01-22: 30 mg via INTRAMUSCULAR
  Filled 2017-01-22: qty 2

## 2017-01-22 NOTE — ED Triage Notes (Signed)
Pt complains of intermittent mid-back pain since last night. Pt states pain started while she was sitting down. Pt denies injury. Pt states she does not lift heavy objects at work.

## 2017-01-22 NOTE — ED Provider Notes (Signed)
[facility]-EMERGENCY DEPT Provider Note    By signing my name below, I, Earmon Phoenix, attest that this documentation has been prepared under the direction and in the presence of Trinity [place], PA-C. Electronically Signed: Earmon Phoenix, ED Scribe. 01/22/17. 6:25 PM.    History   Chief Complaint Chief Complaint  Patient presents with  . Back Pain   The history is provided by the patient and medical records. No language interpreter was used.    Frances Jones is an obese 21 y.o. female who presents to the Emergency Department complaining of waxing and waning mid back pain that began while sitting at work last night. She reports associated nausea that has since resolved. She states the pain radiates to bilateral flanks and upper abdomen. She describes the pain as sharp and aching and rates it at 10/10 at its worst and 5-6/10 other times. She has taken [facility] Powder for pain with temporary relief. Palpating the area increases the pain. She denies alleviating factors. She denies fever, chills, diarrhea, vomiting, SOB, bowel or bladder incontinence, numbness, tingling or weakness of any extremity. She denies trauma, injury or fall. She denies h/o nephrolithiasis. She has Nexplanon for birth control and states her periods are abnormal.    Past Medical History:  Diagnosis Date  . Acid reflux   . Asthma   . Migraines     There are no active problems to display for this patient.   Past Surgical History:  Procedure Laterality Date  . TONSILLECTOMY      OB History    No data available       Home Medications    Prior to Admission medications   Medication Sig Start Date End Date Taking? Authorizing Provider  albuterol (PROAIR HFA) 108 (90 Base) MCG/ACT inhaler Inhale 2 puffs into the lungs every 6 (six) hours as needed for wheezing or shortness of breath (FOR EXERCISE-INDUCED ASTHMA).    Historical Provider, MD  cyclobenzaprine (FLEXERIL) 10 MG tablet Take 1 tablet (10 mg total) by mouth 3  (three) times daily as needed for muscle spasms. 01/22/17 01/25/17  Taejah Ohalloran A Gumaro Brightbill, PA-C  naproxen (NAPROSYN) 500 MG tablet Take 1 tablet (500 mg total) by mouth 2 (two) times daily. 07/16/16   Elpidio Anis, PA-C    Family History Family History  Problem Relation Age of Onset  . Diabetes Mother   . Cancer Other   . Stroke Other     Social History Social History  Substance Use Topics  . Smoking status: Current Some Day Smoker  . Smokeless tobacco: Never Used  . Alcohol use Yes     Allergies   Patient has no known allergies.   Review of Systems Review of Systems  Constitutional: Negative for chills and fever.  Respiratory: Negative for shortness of breath.   Gastrointestinal: Positive for abdominal pain and nausea. Negative for diarrhea and vomiting.       No bowel or bladder incontinence  Genitourinary: Positive for flank pain.  Musculoskeletal: Positive for back pain.  Neurological: Negative for weakness and numbness.     Physical Exam Updated Vital Signs BP 138/83 (BP Location: Right Arm)   Pulse 70   Temp 97.9 F (36.6 C) (Oral)   Resp 18   SpO2 100%   Physical Exam  Constitutional: She is oriented to person, place, and time. She appears well-developed and well-nourished. No distress.  HENT:  Head: Normocephalic and atraumatic.  Eyes: Conjunctivae are normal.  Neck: Normal range of motion. Neck supple. No JVD  present. No tracheal deviation present.  No midline CSP ttp, no paraspinal muscle spasm  Cardiovascular: Normal rate, regular rhythm, normal heart sounds and intact distal pulses.   No murmur heard. 2+ radial and DP/PT pulses bl, negative Homan's bl   Pulmonary/Chest: Effort normal and breath sounds normal. No respiratory distress. She exhibits no tenderness.  Abdominal: Soft. Bowel sounds are normal. She exhibits no distension. There is no tenderness.  Genitourinary:  Genitourinary Comments: No CVA tenderness  Musculoskeletal: Normal range of motion.  She exhibits tenderness. She exhibits no edema.  LSP ttp around L3/4, some paraspinal muscle ttp Lateral lower ribs ttp L > R.  Full ROM of lumbar spine, pain with flexion but not extension or lateral motion. 5/5 strength of BUEand BLE  Neurological: She is alert and oriented to person, place, and time. No sensory deficit.  Fluent speech, no facial droop, sensation intact globally, normal gait, and patient able to heel walk and toe walk without difficulty.   Skin: Skin is warm and dry. Capillary refill takes less than 2 seconds. No rash noted.  Psychiatric: She has a normal mood and affect. Her behavior is normal.  Nursing note and vitals reviewed.    ED Treatments / Results  DIAGNOSTIC STUDIES: Oxygen Saturation is 100% on RA, normal by my interpretation.   COORDINATION OF CARE: 5:29 PM- Will order injection of Toradol and CXR prior to discharge. Pt verbalizes understanding and agrees to plan.  Medications  ketorolac (TORADOL) injection 30 mg (30 mg Intramuscular Given 01/22/17 1750)    Labs (all labs ordered are listed, but only abnormal results are displayed) Labs Reviewed  URINALYSIS, ROUTINE W REFLEX MICROSCOPIC  POC URINE PREG, ED    EKG  EKG Interpretation None       Radiology Dg Chest 2 View  Result Date: 01/22/2017 CLINICAL DATA:  Posterior low back pain for 1 day, initial encounter EXAM: CHEST  2 VIEW COMPARISON:  07/15/2016 FINDINGS: The heart size and mediastinal contours are within normal limits. Both lungs are clear. The visualized skeletal structures are unremarkable. IMPRESSION: No active cardiopulmonary disease. Electronically Signed   By: Alcide Clever M.D.   On: 01/22/2017 18:18    Procedures Procedures (including critical care time)  Medications Ordered in ED Medications  ketorolac (TORADOL) injection 30 mg (30 mg Intramuscular Given 01/22/17 1750)     Initial Impression / Assessment and Plan / ED Course  I have reviewed the triage vital signs and  the nursing notes.  Pertinent labs & imaging results that were available during my care of the patient were reviewed by me and considered in my medical decision making (see chart for details).     Patient with acute onset intermittent mid back pain with intermittent radiation to the flanks. No known trauma. Patient afebrile, vital signs stable. Abdominal examination unremarkable. UA normal. Low suspicion UTI, nephrolithiasis, pyelonephritis. Chest x-ray normal, no fractures. Toradol given in ED helpful for pain. Suspect pain is musculoskeletal in nature. Discussed symptomatic care with ibuprofen, Tylenol, ice, heat, gentle stretching, and muscle relaxer at night to aid with sleep. Patient will follow up with student health or primary care for reevaluation in 2-3 days. Discussed strict ED return precautions. Rx for Flexeril given. Pt verbalized understanding of and agreement with plan and is safe for discharge home at this time.   Final Clinical Impressions(s) / ED Diagnoses   Final diagnoses:  Acute bilateral low back pain without sciatica    New Prescriptions Discharge Medication List as of  01/22/2017  6:51 PM    START taking these medications   Details  cyclobenzaprine (FLEXERIL) 10 MG tablet Take 1 tablet (10 mg total) by mouth 3 (three) times daily as needed for muscle spasms., Starting Wed 01/22/2017, Until Sat 01/25/2017, Print       I personally performed the services described in this documentation, which was scribed in my presence. The recorded information has been reviewed and is accurate.    Jeanie Sewer, PA-C 01/22/17 1939    Doug Sou, MD 01/22/17 [phone]

## 2017-01-22 NOTE — Discharge Instructions (Signed)

## 2017-03-24 DIAGNOSIS — Z3046 Encounter for surveillance of implantable subdermal contraceptive: Secondary | ICD-10-CM | POA: Diagnosis not present

## 2017-05-06 DIAGNOSIS — Z23 Encounter for immunization: Secondary | ICD-10-CM | POA: Diagnosis not present

## 2017-07-18 ENCOUNTER — Emergency Department (HOSPITAL_BASED_OUTPATIENT_CLINIC_OR_DEPARTMENT_OTHER)
Admission: EM | Admit: 2017-07-18 | Discharge: 2017-07-18 | Disposition: A | Discharge status: Home / Self Care | Payer: BLUE CROSS/BLUE SHIELD | Attending: Emergency Medicine | Admitting: Emergency Medicine

## 2017-07-18 ENCOUNTER — Encounter (HOSPITAL_BASED_OUTPATIENT_CLINIC_OR_DEPARTMENT_OTHER): Payer: Self-pay

## 2017-07-18 DIAGNOSIS — M5489 Other dorsalgia: Secondary | ICD-10-CM | POA: Diagnosis not present

## 2017-07-18 DIAGNOSIS — M545 Low back pain, unspecified: Secondary | ICD-10-CM

## 2017-07-18 DIAGNOSIS — J45909 Unspecified asthma, uncomplicated: Secondary | ICD-10-CM | POA: Diagnosis not present

## 2017-07-18 DIAGNOSIS — F17290 Nicotine dependence, other tobacco product, uncomplicated: Secondary | ICD-10-CM | POA: Diagnosis not present

## 2017-07-18 MED ORDER — METHOCARBAMOL 500 MG PO TABS
1000.0000 mg | ORAL_TABLET | Freq: Once | ORAL | Status: AC
  Administered 2017-07-18: 1000 mg via ORAL
  Filled 2017-07-18: qty 2

## 2017-07-18 MED ORDER — CYCLOBENZAPRINE HCL 10 MG PO TABS: 10.0000 mg | ORAL_TABLET | Freq: Two times a day (BID) | ORAL | 0 refills | Status: DC | PRN

## 2017-07-18 MED ORDER — PANTOPRAZOLE SODIUM 20 MG PO TBEC: 20.0000 mg | DELAYED_RELEASE_TABLET | Freq: Every day | ORAL | 0 refills | Status: DC

## 2017-07-18 MED ORDER — NAPROXEN 500 MG PO TABS: 500.0000 mg | ORAL_TABLET | Freq: Two times a day (BID) | ORAL | 0 refills | Status: DC

## 2017-07-18 MED ORDER — KETOROLAC TROMETHAMINE 60 MG/2ML IM SOLN
60.0000 mg | Freq: Once | INTRAMUSCULAR | Status: AC
  Administered 2017-07-18: 60 mg via INTRAMUSCULAR
  Filled 2017-07-18: qty 2

## 2017-07-18 NOTE — ED Notes (Signed)
ED Provider at bedside. 

## 2017-07-18 NOTE — ED Notes (Signed)
Pt discharged to home with family. NAD. Steady slow gait.

## 2017-07-18 NOTE — ED Triage Notes (Signed)
C/o pain to mid/upper back x "months"-denies injury-slow steady gait

## 2017-07-18 NOTE — Discharge Instructions (Signed)
Please read and follow all provided instructions.  Your diagnoses today include:  1. Acute bilateral low back pain without sciatica     Tests performed today include:  Vital signs - see below for your results today  Medications prescribed:   Naproxen - anti-inflammatory pain medication  Do not exceed 500mg  naproxen every 12 hours, take with food  You have been prescribed an anti-inflammatory medication or NSAID. Take with food. Take smallest effective dose for the shortest duration needed for your pain. Stop taking if you experience stomach pain or vomiting.    Pantoprazole - stomach acid reducer    Flexeril (cyclobenzaprine) - muscle relaxer medication  DO NOT drive or perform any activities that require you to be awake and alert because this medicine can make you drowsy.   This medication can be found over-the-counter  Take any prescribed medications only as directed.  Home care instructions:   Follow any educational materials contained in this packet  Please rest, use ice or heat on your back for the next several days  Do not lift, push, pull anything more than 10 pounds for the next week  Follow-up instructions: Please follow-up with your primary care provider when able for further evaluation of your symptoms.   Return instructions:  SEEK IMMEDIATE MEDICAL ATTENTION IF YOU HAVE:  New numbness, tingling, weakness, or problem with the use of your arms or legs  Severe back pain not relieved with medications  Loss control of your bowels or bladder  Increasing pain in any areas of the body (such as chest or abdominal pain)  Shortness of breath, dizziness, or fainting.   Worsening nausea (feeling sick to your stomach), vomiting, fever, or sweats  Any other emergent concerns regarding your health   Additional Information:  Your vital signs today were: BP (!) 142/78 (BP Location: Left Arm)    Pulse (!) 58    Temp 98 F (36.7 C) (Oral)    Resp 18    Ht 5\' 7"   (1.702 m)    Wt 126.1 kg (278 lb)    LMP 07/07/2017    SpO2 98%    BMI 43.54 kg/m  If your blood pressure (BP) was elevated above 135/85 this visit, please have this repeated by your doctor within one month. --------------

## 2017-07-18 NOTE — ED Provider Notes (Signed)
[facility] EMERGENCY DEPARTMENT Provider Note   CSN: [id number] Arrival date & time: 07/18/17  1617     History   Chief Complaint Chief Complaint  Patient presents with  . Back Pain    HPI Frances Jones is a 21 y.o. female.  Patient with history of lower back pain for approximately 5 months, intermittent --presents with acute on chronic bilateral lower back pain starting in the past day.  Patient describes pain across her upper lumbar spine, not involving the midline, that does not radiate.  Pain is worse with twisting and bending.  She denies acute injury.  Patient is a Psychologist, sport and exercise.  She has a primary care physician appointment scheduled for next month.  She has not had any evaluation outside of an x-ray.  She has been treated with muscle relaxers in the past which have helped her sleep.  She also has a history of acid reflux and took Tums this for this this morning.  She had some abdominal pain at onset that is now resolved. Patient denies warning symptoms of back pain including: fecal incontinence, urinary retention or overflow incontinence, night sweats, waking from sleep with back pain, unexplained fevers or weight loss, h/o cancer, IVDU, recent trauma. Sent from [facility] for consideration of CT. No hematuria, unilateral flank pain or history of kidney stones.        Past Medical History:  Diagnosis Date  . Acid reflux   . Asthma   . Migraines     There are no active problems to display for this patient.   Past Surgical History:  Procedure Laterality Date  . TONSILLECTOMY      OB History    No data available       Home Medications    Prior to Admission medications   Not on File    Family History Family History  Problem Relation Age of Onset  . Diabetes Mother   . Cancer Other   . Stroke Other     Social History Social History  Substance Use Topics  . Smoking status: Current Some Day Smoker    Types: Cigars  . Smokeless tobacco:  Never Used  . Alcohol use Yes     Comment: occ     Allergies   Patient has no known allergies.   Review of Systems Review of Systems  Constitutional: Negative for fever and unexpected weight change.  Gastrointestinal: Negative for constipation.       Negative for fecal incontinence.   Genitourinary: Negative for dysuria, flank pain, hematuria, pelvic pain, vaginal bleeding and vaginal discharge.       Negative for urinary incontinence or retention.  Musculoskeletal: Positive for back pain.  Neurological: Negative for weakness and numbness.       Denies saddle paresthesias.     Physical Exam Updated Vital Signs BP (!) 142/78 (BP Location: Left Arm)   Pulse (!) 58   Temp 98 F (36.7 C) (Oral)   Resp 18   Ht 5\' 7"  (1.702 m)   Wt 126.1 kg (278 lb)   LMP 07/07/2017   SpO2 98%   BMI 43.54 kg/m   Physical Exam  Constitutional: She appears well-developed and well-nourished.  HENT:  Head: Normocephalic and atraumatic.  Eyes: Conjunctivae are normal.  Neck: Normal range of motion. Neck supple.  Pulmonary/Chest: Effort normal.  Abdominal: Soft. There is no tenderness. There is no CVA tenderness.  Musculoskeletal: Normal range of motion.       Right hip: Normal.  Left hip: Normal.       Right knee: Normal.       Left knee: Normal.       Cervical back: She exhibits normal range of motion, no tenderness and no bony tenderness.       Thoracic back: She exhibits normal range of motion, no tenderness and no bony tenderness.       Lumbar back: She exhibits tenderness. She exhibits normal range of motion and no bony tenderness.       Back:  No step-off noted with palpation of spine.   Neurological: She is alert. She has normal strength and normal reflexes. No sensory deficit.  5/5 strength in entire lower extremities bilaterally. No sensation deficit.   Skin: Skin is warm and dry. No rash noted.  Psychiatric: She has a normal mood and affect.  Nursing note and vitals  reviewed.    ED Treatments / Results  Labs (all labs ordered are listed, but only abnormal results are displayed) Labs Reviewed - No data to display  EKG  EKG Interpretation None       Radiology No results found.  Procedures Procedures (including critical care time)  Medications Ordered in ED Medications  ketorolac (TORADOL) injection 60 mg (60 mg Intramuscular Given 07/18/17 1712)  methocarbamol (ROBAXIN) tablet 1,000 mg (1,000 mg Oral Given 07/18/17 1712)     Initial Impression / Assessment and Plan / ED Course  I have reviewed the triage vital signs and the nursing notes.  Pertinent labs & imaging results that were available during my care of the patient were reviewed by me and considered in my medical decision making (see chart for details).     Patient seen and examined. Discussed need for CT imaging with Dr. Ranae PalmsYelverton.  Do not feel that CT would be warranted at this time.  Given persistent symptoms, patient needs to follow-up with primary care or neurosurgeon for consideration of MRI which would give us more information.  Patient has no red flag signs and symptoms of lower back pain to necessitate transfer for emergent MRI at this time.  Will treat with IM Toradol, muscle relaxer medication.  Vital signs reviewed and are as follows: BP (!) 142/78 (BP Location: Left Arm)   Pulse (!) 58   Temp 98 F (36.7 C) (Oral)   Resp 18   Ht 5\' 7"  (1.702 m)   Wt 126.1 kg (278 lb)   LMP 07/07/2017   SpO2 98%   BMI 43.54 kg/m    5:51 PM Pt stable.  We will discharge to the inflammatories, muscle relaxer.  Will give neurosurgery referral if desired.  Encourage PCP follow-up.  Encourage stretching when her back is improved.  No red flag s/s of low back pain. Patient was counseled on back pain precautions and told to do activity as tolerated but do not lift, push, or pull heavy objects more than 10 pounds for the next week.  Patient counseled to use ice or heat on back for no  longer than 15 minutes every hour.   Patient counseled on proper use of muscle relaxant medication.  They were told not to drink alcohol, drive any vehicle, or do any dangerous activities while taking this medication.  Patient verbalized understanding.  Patient urged to follow-up with PCP if pain does not improve with treatment and rest or if pain becomes recurrent. Urged to return with worsening severe pain, loss of bowel or bladder control, trouble walking.   The patient verbalizes understanding and agrees  with the plan.    Final Clinical Impressions(s) / ED Diagnoses   Final diagnoses:  Acute bilateral low back pain without sciatica   Patient with back pain without radicular features. No neurological deficits. Patient is ambulatory. No warning symptoms of back pain including: fecal incontinence, urinary retention or overflow incontinence, night sweats, waking from sleep with back pain, unexplained fevers or weight loss, h/o cancer, IVDU, recent trauma. No concern for cauda equina, epidural abscess, or other serious cause of back pain. Conservative measures such as rest, ice/heat and pain medicine indicated with PCP follow-up if no improvement with conservative management.  Do not feel that CT imaging, given no concern for metastatic cancer, trauma, would be beneficial today.  Discussed with patient.    New Prescriptions New Prescriptions   CYCLOBENZAPRINE (FLEXERIL) 10 MG TABLET    Take 1 tablet (10 mg total) by mouth 2 (two) times daily as needed for muscle spasms.   NAPROXEN (NAPROSYN) 500 MG TABLET    Take 1 tablet (500 mg total) by mouth 2 (two) times daily.   PANTOPRAZOLE (PROTONIX) 20 MG TABLET    Take 1 tablet (20 mg total) by mouth daily.     Renne Crigler, PA-C 07/18/17 1754    Loren Racer, MD 07/20/17 [phone]

## 2017-10-06 DIAGNOSIS — J4520 Mild intermittent asthma, uncomplicated: Secondary | ICD-10-CM | POA: Diagnosis not present

## 2017-10-06 DIAGNOSIS — M545 Low back pain: Secondary | ICD-10-CM | POA: Diagnosis not present

## 2017-10-06 DIAGNOSIS — Z8669 Personal history of other diseases of the nervous system and sense organs: Secondary | ICD-10-CM | POA: Diagnosis not present

## 2017-10-22 DIAGNOSIS — Z0000 Encounter for general adult medical examination without abnormal findings: Secondary | ICD-10-CM | POA: Diagnosis not present

## 2017-12-31 DIAGNOSIS — Z111 Encounter for screening for respiratory tuberculosis: Secondary | ICD-10-CM | POA: Diagnosis not present

## 2018-01-23 DIAGNOSIS — Z111 Encounter for screening for respiratory tuberculosis: Secondary | ICD-10-CM | POA: Diagnosis not present

## 2018-01-23 DIAGNOSIS — M5442 Lumbago with sciatica, left side: Secondary | ICD-10-CM | POA: Diagnosis not present

## 2018-06-24 DIAGNOSIS — R1010 Upper abdominal pain, unspecified: Secondary | ICD-10-CM | POA: Diagnosis not present

## 2018-06-26 ENCOUNTER — Other Ambulatory Visit: Payer: Self-pay | Admitting: Family Medicine

## 2018-06-26 DIAGNOSIS — K8020 Calculus of gallbladder without cholecystitis without obstruction: Secondary | ICD-10-CM

## 2018-06-26 DIAGNOSIS — R1010 Upper abdominal pain, unspecified: Secondary | ICD-10-CM

## 2018-07-06 ENCOUNTER — Ambulatory Visit
Admission: RE | Admit: 2018-07-06 | Discharge: 2018-07-06 | Disposition: A | Discharge status: Home / Self Care | Payer: BLUE CROSS/BLUE SHIELD | Source: Ambulatory Visit | Attending: Family Medicine | Admitting: Family Medicine

## 2018-07-06 DIAGNOSIS — K820 Obstruction of gallbladder: Secondary | ICD-10-CM | POA: Diagnosis not present

## 2018-07-06 DIAGNOSIS — K8020 Calculus of gallbladder without cholecystitis without obstruction: Secondary | ICD-10-CM | POA: Diagnosis not present

## 2018-07-06 DIAGNOSIS — R1010 Upper abdominal pain, unspecified: Secondary | ICD-10-CM

## 2018-08-10 ENCOUNTER — Other Ambulatory Visit: Payer: Self-pay | Admitting: General Surgery

## 2018-08-10 DIAGNOSIS — K8020 Calculus of gallbladder without cholecystitis without obstruction: Secondary | ICD-10-CM | POA: Diagnosis not present

## 2018-08-10 DIAGNOSIS — Z6841 Body Mass Index (BMI) 40.0 and over, adult: Secondary | ICD-10-CM | POA: Diagnosis not present

## 2018-08-10 DIAGNOSIS — K219 Gastro-esophageal reflux disease without esophagitis: Secondary | ICD-10-CM | POA: Diagnosis not present

## 2018-08-10 DIAGNOSIS — J45909 Unspecified asthma, uncomplicated: Secondary | ICD-10-CM | POA: Diagnosis not present

## 2018-10-07 DIAGNOSIS — Z111 Encounter for screening for respiratory tuberculosis: Secondary | ICD-10-CM | POA: Diagnosis not present

## 2019-09-01 IMAGING — US US ABDOMEN COMPLETE
1 series · 13 of 25 positions shown · non-contrast
Comparison: None.

CLINICAL DATA: Intermittent upper abdominal and epigastric pain
radiating to the back over the past year. Family history of
gallstones.

EXAM:
ABDOMEN ULTRASOUND COMPLETE

[Series 1: us abdomen complete · 0.22mm/px · 13 of 86 slices shown]
[im 1/86]
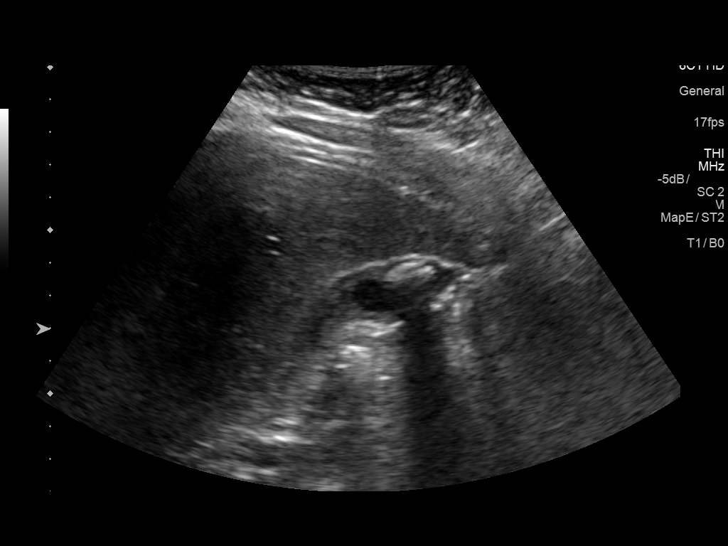
[im 8/86]
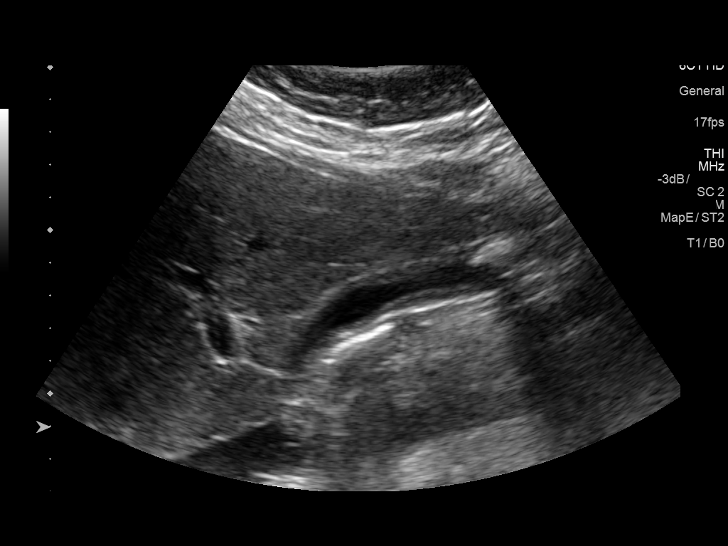
[im 15/86]
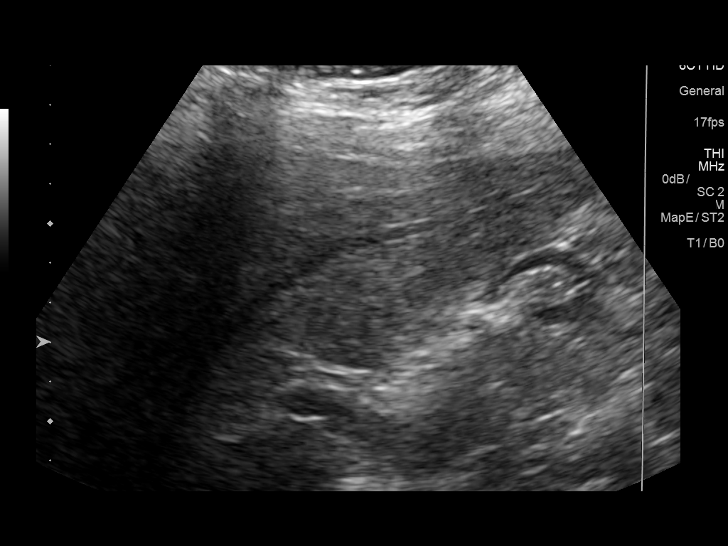
[im 22/86]
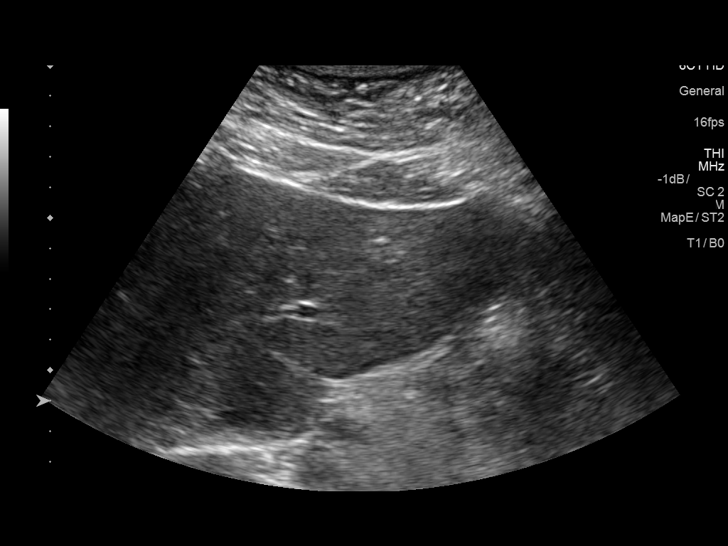
[im 29/86]
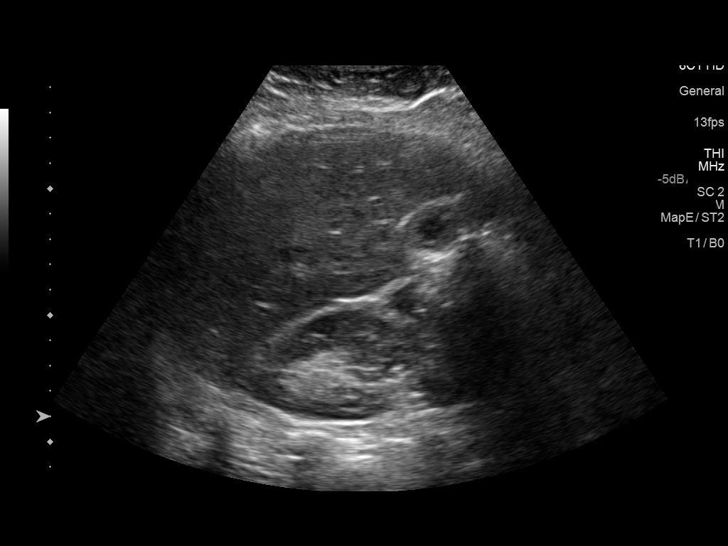
[im 36/86]
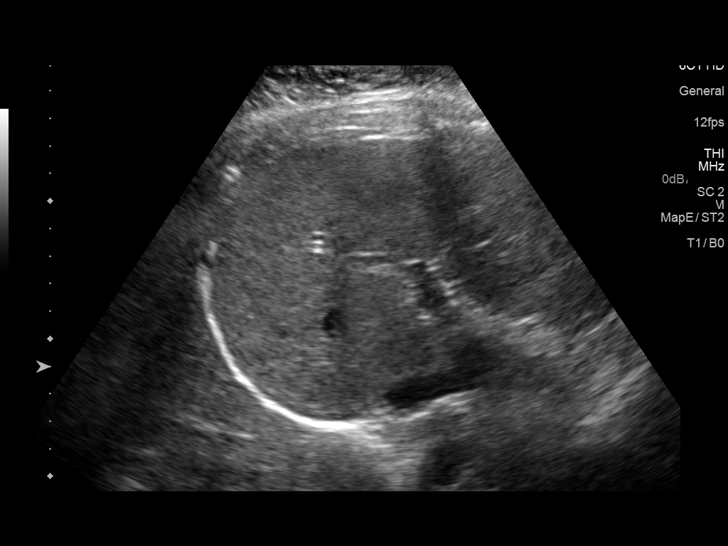
[im 43/86]
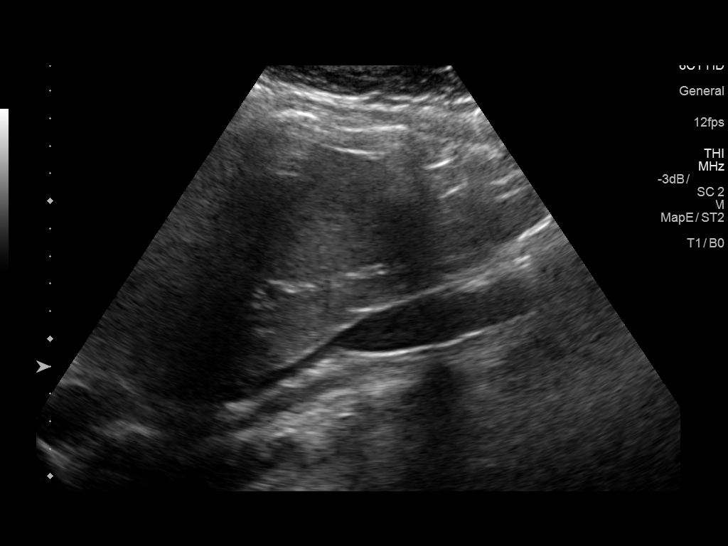
[im 50/86]
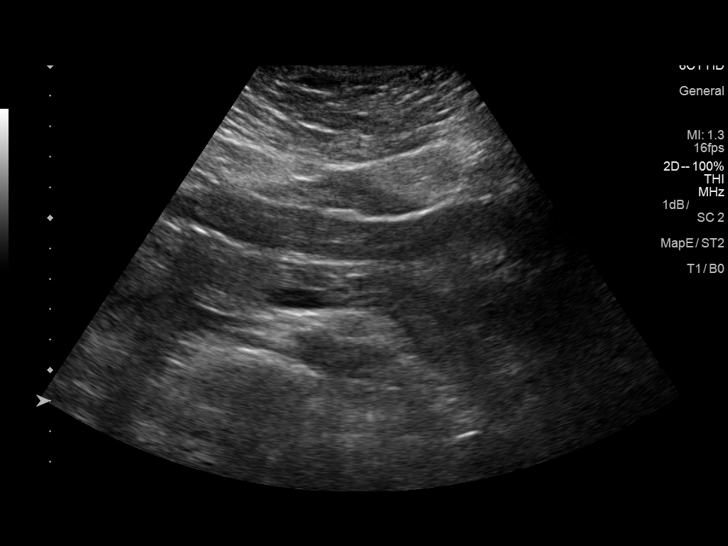
[im 57/86]
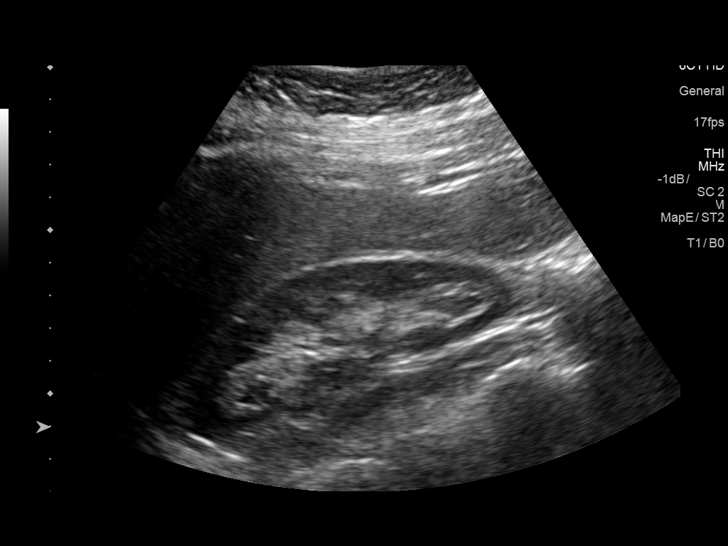
[im 64/86]
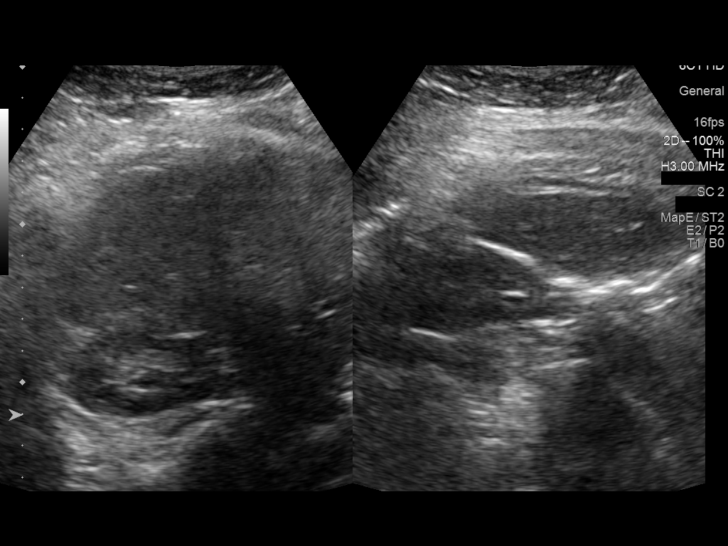
[im 71/86]
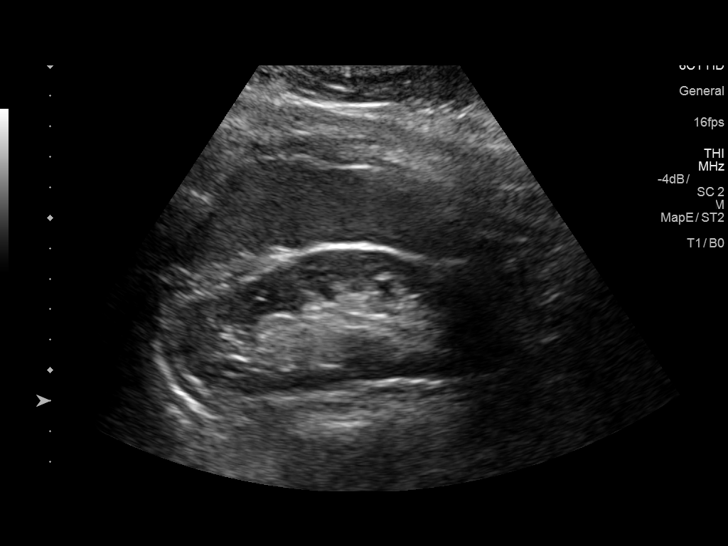
[im 78/86]
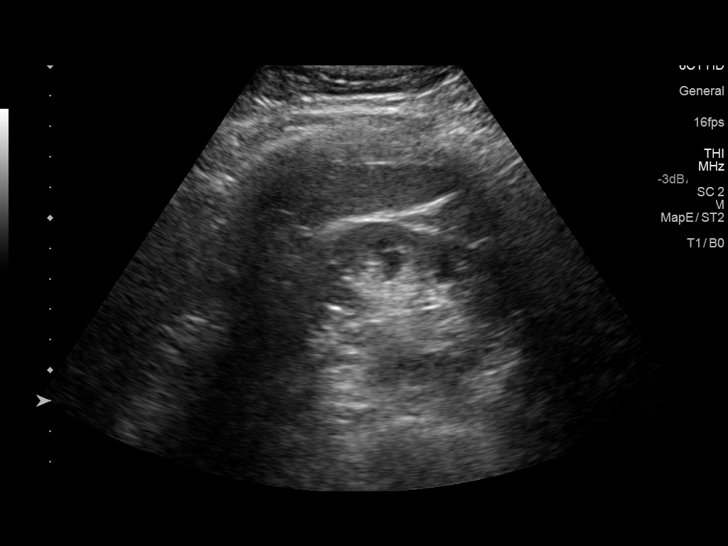
[im 86/86]
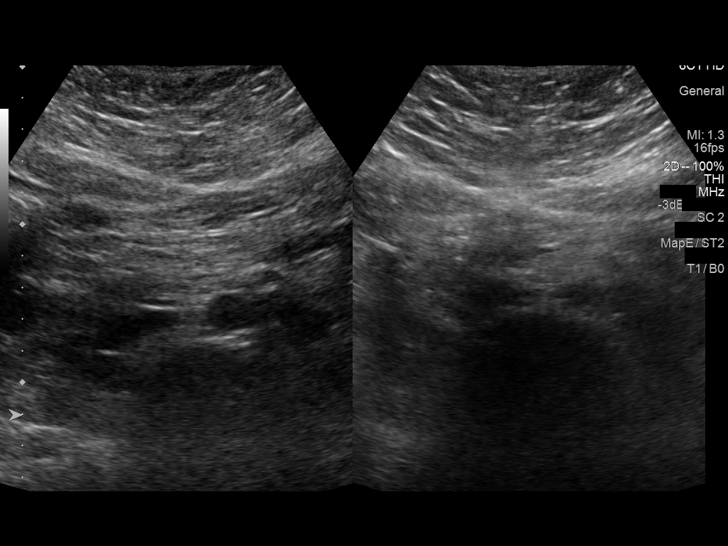

[13 of 25 positions shown; findings below may reference images not displayed]

FINDINGS: Gallbladder: The gallbladder is only partially distended. There is a
shadowing mobile non mobile gallstone measuring 15 mm in diameter in
the fundus. There is thickening of the gallbladder wall to 5 mm.
There is no pericholecystic fluid or positive sonographic Murphy's
sign.

Common bile duct: Diameter: 2.5 mm

Liver: No focal lesion identified. Within normal limits in
parenchymal echogenicity. Portal vein is patent on color Doppler
imaging with normal direction of blood flow towards the liver.

IVC: No abnormality visualized.

Pancreas: Visualized portion unremarkable.

Spleen: Size and appearance within normal limits.

Right Kidney: Length: 10.7 cm. Echogenicity within normal limits. No
mass or hydronephrosis visualized.

Left Kidney: Length: 11.2 cm. Echogenicity within normal limits. No
mass or hydronephrosis visualized.

Abdominal aorta: No aneurysm visualized.

Other findings: There is no ascites.
IMPRESSION: Contracted gallbladder exhibiting wall thickening and containing a
stone which appears impacted in the gallbladder neck. No positive
sonographic Murphy's sign.

No acute abnormality observed elsewhere within the abdomen.

## 2019-12-01 DIAGNOSIS — R634 Abnormal weight loss: Secondary | ICD-10-CM | POA: Diagnosis not present

## 2019-12-03 DIAGNOSIS — S4990XA Unspecified injury of shoulder and upper arm, unspecified arm, initial encounter: Secondary | ICD-10-CM | POA: Diagnosis not present

## 2019-12-03 DIAGNOSIS — S199XXA Unspecified injury of neck, initial encounter: Secondary | ICD-10-CM | POA: Diagnosis not present

## 2020-02-18 DIAGNOSIS — M25473 Effusion, unspecified ankle: Secondary | ICD-10-CM | POA: Diagnosis not present

## 2020-02-18 DIAGNOSIS — R609 Edema, unspecified: Secondary | ICD-10-CM | POA: Diagnosis not present

## 2020-03-10 DIAGNOSIS — H00019 Hordeolum externum unspecified eye, unspecified eyelid: Secondary | ICD-10-CM | POA: Diagnosis not present

## 2020-07-11 DIAGNOSIS — M2550 Pain in unspecified joint: Secondary | ICD-10-CM | POA: Diagnosis not present

## 2020-07-11 DIAGNOSIS — Z23 Encounter for immunization: Secondary | ICD-10-CM | POA: Diagnosis not present

## 2021-06-12 ENCOUNTER — Other Ambulatory Visit: Payer: Self-pay

## 2021-06-12 ENCOUNTER — Inpatient Hospital Stay (HOSPITAL_COMMUNITY)
Admission: EM | Admit: 2021-06-12 | Discharge: 2021-06-14 | DRG: 418 | Disposition: A | Discharge status: Home / Self Care | Payer: BC Managed Care – PPO | Attending: Surgery | Admitting: Surgery

## 2021-06-12 ENCOUNTER — Encounter (HOSPITAL_COMMUNITY): Payer: Self-pay | Admitting: *Deleted

## 2021-06-12 DIAGNOSIS — R001 Bradycardia, unspecified: Secondary | ICD-10-CM | POA: Diagnosis present

## 2021-06-12 DIAGNOSIS — K819 Cholecystitis, unspecified: Secondary | ICD-10-CM | POA: Diagnosis present

## 2021-06-12 DIAGNOSIS — K8050 Calculus of bile duct without cholangitis or cholecystitis without obstruction: Secondary | ICD-10-CM

## 2021-06-12 DIAGNOSIS — J45909 Unspecified asthma, uncomplicated: Secondary | ICD-10-CM | POA: Diagnosis present

## 2021-06-12 DIAGNOSIS — Z419 Encounter for procedure for purposes other than remedying health state, unspecified: Secondary | ICD-10-CM

## 2021-06-12 DIAGNOSIS — R7401 Elevation of levels of liver transaminase levels: Secondary | ICD-10-CM

## 2021-06-12 DIAGNOSIS — Z823 Family history of stroke: Secondary | ICD-10-CM

## 2021-06-12 DIAGNOSIS — F1210 Cannabis abuse, uncomplicated: Secondary | ICD-10-CM | POA: Diagnosis present

## 2021-06-12 DIAGNOSIS — K219 Gastro-esophageal reflux disease without esophagitis: Secondary | ICD-10-CM | POA: Diagnosis present

## 2021-06-12 DIAGNOSIS — Z6841 Body Mass Index (BMI) 40.0 and over, adult: Secondary | ICD-10-CM

## 2021-06-12 DIAGNOSIS — K8064 Calculus of gallbladder and bile duct with chronic cholecystitis without obstruction: Principal | ICD-10-CM | POA: Diagnosis present

## 2021-06-12 DIAGNOSIS — F17290 Nicotine dependence, other tobacco product, uncomplicated: Secondary | ICD-10-CM | POA: Diagnosis present

## 2021-06-12 DIAGNOSIS — Z9109 Other allergy status, other than to drugs and biological substances: Secondary | ICD-10-CM

## 2021-06-12 DIAGNOSIS — Z833 Family history of diabetes mellitus: Secondary | ICD-10-CM

## 2021-06-12 DIAGNOSIS — Z20822 Contact with and (suspected) exposure to covid-19: Secondary | ICD-10-CM | POA: Diagnosis present

## 2021-06-12 LAB — COMPREHENSIVE METABOLIC PANEL
ALT: 489 U/L — ABNORMAL HIGH (ref 0–44)
AST: 711 U/L — ABNORMAL HIGH (ref 15–41)
Albumin: 3.5 g/dL (ref 3.5–5.0)
Alkaline Phosphatase: 77 U/L (ref 38–126)
Anion gap: 7 (ref 5–15)
BUN: <5 mg/dL — ABNORMAL LOW (ref 6–20)
CO2: 25 mmol/L (ref 22–32)
Calcium: 8.9 mg/dL (ref 8.9–10.3)
Chloride: 104 mmol/L (ref 98–111)
Creatinine, Ser: 0.9 mg/dL (ref 0.44–1.00)
GFR, Estimated: >60 mL/min (ref >60)
Glucose, Bld: 101 mg/dL — ABNORMAL HIGH (ref 70–99)
Potassium: 4 mmol/L (ref 3.5–5.1)
Sodium: 136 mmol/L (ref 135–145)
Total Bilirubin: 2 mg/dL — ABNORMAL HIGH (ref 0.3–1.2)
Total Protein: 6.4 g/dL — ABNORMAL LOW (ref 6.5–8.1)

## 2021-06-12 LAB — CBC
HCT: 41.7 % (ref 36.0–46.0)
Hemoglobin: 14 g/dL (ref 12.0–15.0)
MCH: 33.1 pg (ref 26.0–34.0)
MCHC: 33.6 g/dL (ref 30.0–36.0)
MCV: 98.6 fL (ref 80.0–100.0)
Platelets: 268 10*3/uL (ref 150–400)
RBC: 4.23 MIL/uL (ref 3.87–5.11)
RDW: 13 % (ref 11.5–15.5)
WBC: 6.9 10*3/uL (ref 4.0–10.5)
nRBC: 0 % (ref 0.0–0.2)

## 2021-06-12 LAB — URINALYSIS, ROUTINE W REFLEX MICROSCOPIC
Glucose, UA: NEGATIVE mg/dL
Hgb urine dipstick: NEGATIVE
Ketones, ur: NEGATIVE mg/dL
Leukocytes,Ua: NEGATIVE
Nitrite: NEGATIVE
Protein, ur: NEGATIVE mg/dL
Specific Gravity, Urine: <1.005 — ABNORMAL LOW (ref 1.005–1.030)
pH: 6.5 (ref 5.0–8.0)

## 2021-06-12 LAB — LIPASE, BLOOD: Lipase: 29 U/L (ref 11–51)

## 2021-06-12 LAB — I-STAT BETA HCG BLOOD, ED (MC, WL, AP ONLY): I-stat hCG, quantitative: <5 m[IU]/mL (ref <5)

## 2021-06-12 MED ORDER — ACETAMINOPHEN 500 MG PO TABS
500.0000 mg | ORAL_TABLET | Freq: Once | ORAL | Status: AC
  Administered 2021-06-12: 500 mg via ORAL
  Filled 2021-06-12: qty 1

## 2021-06-12 NOTE — ED Provider Notes (Signed)
Emergency Medicine Provider Triage Evaluation Note  Frances Jones , a 25 y.o. female  was evaluated in triage.  Pt complains of right upper abdominal pain for several weeks. Has had nausea and vomiting today. Pain worse after eating. Hx of gallstones. Has been trying ibuprofen and tylenol for pain with mild relief.   Review of Systems  Positive: Abdominal pain, nausea, vomiting Negative: Fevers, chills  Physical Exam  BP 128/78 (BP Location: Left Arm)   Pulse 60   Temp 98.5 F (36.9 C) (Oral)   Resp 16   LMP 05/13/2021 (Approximate)   SpO2 99%  Gen:   Awake, no distress   Resp:  Normal effort  MSK:   Moves extremities without difficulty  Other:    Medical Decision Making  Medically screening exam initiated at 8:37 PM.  Appropriate orders placed.  Frances Jones was informed that the remainder of the evaluation will be completed by another provider, this initial triage assessment does not replace that evaluation, and the importance of remaining in the ED until their evaluation is complete.     Jeanella Flattery 06/12/21 2043    Tegeler, Canary Brim, MD 06/12/21 2250

## 2021-06-12 NOTE — ED Triage Notes (Signed)
Pt with right upper abdominal pain, worse after eating. Nausea. Vomited today x 1. Hx of gallstones.

## 2021-06-13 ENCOUNTER — Encounter (HOSPITAL_COMMUNITY): Payer: Self-pay

## 2021-06-13 ENCOUNTER — Emergency Department (HOSPITAL_COMMUNITY): Payer: BC Managed Care – PPO | Admitting: Anesthesiology

## 2021-06-13 ENCOUNTER — Encounter (HOSPITAL_COMMUNITY): Admission: EM | Disposition: A | Discharge status: Home / Self Care | Payer: Self-pay | Source: Home / Self Care

## 2021-06-13 ENCOUNTER — Emergency Department (HOSPITAL_COMMUNITY): Payer: BC Managed Care – PPO

## 2021-06-13 DIAGNOSIS — K819 Cholecystitis, unspecified: Secondary | ICD-10-CM | POA: Diagnosis present

## 2021-06-13 DIAGNOSIS — K8050 Calculus of bile duct without cholangitis or cholecystitis without obstruction: Secondary | ICD-10-CM | POA: Diagnosis not present

## 2021-06-13 HISTORY — PX: CHOLECYSTECTOMY: SHX55

## 2021-06-13 LAB — BASIC METABOLIC PANEL
Anion gap: 10 (ref 5–15)
BUN: 5 mg/dL — ABNORMAL LOW (ref 6–20)
CO2: 22 mmol/L (ref 22–32)
Calcium: 9.1 mg/dL (ref 8.9–10.3)
Chloride: 104 mmol/L (ref 98–111)
Creatinine, Ser: 0.85 mg/dL (ref 0.44–1.00)
GFR, Estimated: >60 mL/min (ref >60)
Glucose, Bld: 138 mg/dL — ABNORMAL HIGH (ref 70–99)
Potassium: 4.1 mmol/L (ref 3.5–5.1)
Sodium: 136 mmol/L (ref 135–145)

## 2021-06-13 LAB — RESP PANEL BY RT-PCR (FLU A&B, COVID) ARPGX2
Influenza A by PCR: NEGATIVE
Influenza B by PCR: NEGATIVE
SARS Coronavirus 2 by RT PCR: NEGATIVE

## 2021-06-13 LAB — CBC
HCT: 41.9 % (ref 36.0–46.0)
Hemoglobin: 13.9 g/dL (ref 12.0–15.0)
MCH: 32.4 pg (ref 26.0–34.0)
MCHC: 33.2 g/dL (ref 30.0–36.0)
MCV: 97.7 fL (ref 80.0–100.0)
Platelets: 297 10*3/uL (ref 150–400)
RBC: 4.29 MIL/uL (ref 3.87–5.11)
RDW: 13.1 % (ref 11.5–15.5)
WBC: 11.7 10*3/uL — ABNORMAL HIGH (ref 4.0–10.5)
nRBC: 0 % (ref 0.0–0.2)

## 2021-06-13 LAB — MAGNESIUM: Magnesium: 1.7 mg/dL (ref 1.7–2.4)

## 2021-06-13 SURGERY — LAPAROSCOPIC CHOLECYSTECTOMY WITH INTRAOPERATIVE CHOLANGIOGRAM
Anesthesia: General | Site: Abdomen

## 2021-06-13 MED ORDER — ACETAMINOPHEN 500 MG PO TABS
ORAL_TABLET | ORAL | Status: AC
  Administered 2021-06-13: 1000 mg via ORAL
  Filled 2021-06-13: qty 2

## 2021-06-13 MED ORDER — DEXMEDETOMIDINE (PRECEDEX) IN NS 20 MCG/5ML (4 MCG/ML) IV SYRINGE
PREFILLED_SYRINGE | INTRAVENOUS | Status: AC
  Filled 2021-06-13: qty 5

## 2021-06-13 MED ORDER — SODIUM CHLORIDE 0.9 % IR SOLN
Status: DC | PRN
  Administered 2021-06-13: 1000 mL

## 2021-06-13 MED ORDER — DEXAMETHASONE SODIUM PHOSPHATE 10 MG/ML IJ SOLN
INTRAMUSCULAR | Status: DC | PRN
  Administered 2021-06-13: 10 mg via INTRAVENOUS

## 2021-06-13 MED ORDER — CEFAZOLIN SODIUM-DEXTROSE 2-4 GM/100ML-% IV SOLN
INTRAVENOUS | Status: AC
  Filled 2021-06-13: qty 100

## 2021-06-13 MED ORDER — ROCURONIUM BROMIDE 10 MG/ML (PF) SYRINGE
PREFILLED_SYRINGE | INTRAVENOUS | Status: DC | PRN
  Administered 2021-06-13: 50 mg via INTRAVENOUS

## 2021-06-13 MED ORDER — LACTATED RINGERS IV BOLUS
1000.0000 mL | Freq: Once | INTRAVENOUS | Status: AC
  Administered 2021-06-13: 1000 mL via INTRAVENOUS

## 2021-06-13 MED ORDER — SCOPOLAMINE 1 MG/3DAYS TD PT72
MEDICATED_PATCH | TRANSDERMAL | Status: AC
  Filled 2021-06-13: qty 1

## 2021-06-13 MED ORDER — ORAL CARE MOUTH RINSE: 15.0000 mL | Freq: Once | OROMUCOSAL | Status: AC

## 2021-06-13 MED ORDER — OXYCODONE HCL 5 MG PO TABS
5.0000 mg | ORAL_TABLET | ORAL | Status: DC | PRN
  Administered 2021-06-13 – 2021-06-14 (×3): 5 mg via ORAL
  Filled 2021-06-13 (×4): qty 1

## 2021-06-13 MED ORDER — SUCCINYLCHOLINE CHLORIDE 200 MG/10ML IV SOSY
PREFILLED_SYRINGE | INTRAVENOUS | Status: AC
  Filled 2021-06-13: qty 10

## 2021-06-13 MED ORDER — OXYCODONE HCL 5 MG PO TABS
ORAL_TABLET | ORAL | Status: AC
  Filled 2021-06-13: qty 1

## 2021-06-13 MED ORDER — SUCCINYLCHOLINE CHLORIDE 200 MG/10ML IV SOSY
PREFILLED_SYRINGE | INTRAVENOUS | Status: DC | PRN
  Administered 2021-06-13: 120 mg via INTRAVENOUS

## 2021-06-13 MED ORDER — PROMETHAZINE HCL 25 MG/ML IJ SOLN: 6.2500 mg | INTRAMUSCULAR | Status: DC | PRN

## 2021-06-13 MED ORDER — CHLORHEXIDINE GLUCONATE 0.12 % MT SOLN
OROMUCOSAL | Status: AC
  Administered 2021-06-13: 15 mL via OROMUCOSAL
  Filled 2021-06-13: qty 15

## 2021-06-13 MED ORDER — FENTANYL CITRATE (PF) 250 MCG/5ML IJ SOLN
INTRAMUSCULAR | Status: AC
  Filled 2021-06-13: qty 5

## 2021-06-13 MED ORDER — ONDANSETRON 4 MG PO TBDP
4.0000 mg | ORAL_TABLET | Freq: Once | ORAL | Status: AC
  Administered 2021-06-13: 4 mg via ORAL
  Filled 2021-06-13: qty 1

## 2021-06-13 MED ORDER — BUPIVACAINE-EPINEPHRINE (PF) 0.25% -1:200000 IJ SOLN
INTRAMUSCULAR | Status: AC
  Filled 2021-06-13: qty 30

## 2021-06-13 MED ORDER — ONDANSETRON HCL 4 MG/2ML IJ SOLN
INTRAMUSCULAR | Status: DC | PRN
  Administered 2021-06-13: 4 mg via INTRAVENOUS

## 2021-06-13 MED ORDER — MORPHINE SULFATE (PF) 2 MG/ML IV SOLN
2.0000 mg | INTRAVENOUS | Status: DC | PRN
  Administered 2021-06-13: 2 mg via INTRAVENOUS
  Filled 2021-06-13: qty 1

## 2021-06-13 MED ORDER — OXYCODONE-ACETAMINOPHEN 5-325 MG PO TABS
1.0000 | ORAL_TABLET | Freq: Once | ORAL | Status: DC
Start: 2021-06-13 — End: 2021-06-13

## 2021-06-13 MED ORDER — OXYCODONE HCL 5 MG PO TABS
5.0000 mg | ORAL_TABLET | Freq: Once | ORAL | Status: AC | PRN
  Administered 2021-06-13: 5 mg via ORAL

## 2021-06-13 MED ORDER — ONDANSETRON HCL 4 MG/2ML IJ SOLN
INTRAMUSCULAR | Status: AC
  Filled 2021-06-13: qty 2

## 2021-06-13 MED ORDER — 0.9 % SODIUM CHLORIDE (POUR BTL) OPTIME
TOPICAL | Status: DC | PRN
  Administered 2021-06-13: 1000 mL

## 2021-06-13 MED ORDER — LACTATED RINGERS IV SOLN: INTRAVENOUS | Status: DC

## 2021-06-13 MED ORDER — SUGAMMADEX SODIUM 200 MG/2ML IV SOLN
INTRAVENOUS | Status: DC | PRN
  Administered 2021-06-13: 235.8 mg via INTRAVENOUS

## 2021-06-13 MED ORDER — FENTANYL CITRATE (PF) 100 MCG/2ML IJ SOLN
INTRAMUSCULAR | Status: AC
  Filled 2021-06-13: qty 2

## 2021-06-13 MED ORDER — PROPOFOL 10 MG/ML IV BOLUS
INTRAVENOUS | Status: DC | PRN
  Administered 2021-06-13: 200 mg via INTRAVENOUS

## 2021-06-13 MED ORDER — ACETAMINOPHEN 325 MG PO TABS
650.0000 mg | ORAL_TABLET | Freq: Once | ORAL | Status: AC
  Administered 2021-06-13: 650 mg via ORAL
  Filled 2021-06-13: qty 2

## 2021-06-13 MED ORDER — DEXAMETHASONE SODIUM PHOSPHATE 10 MG/ML IJ SOLN
INTRAMUSCULAR | Status: AC
  Filled 2021-06-13: qty 1

## 2021-06-13 MED ORDER — OXYCODONE HCL 5 MG/5ML PO SOLN: 5.0000 mg | Freq: Once | ORAL | Status: AC | PRN

## 2021-06-13 MED ORDER — MIDAZOLAM HCL 2 MG/2ML IJ SOLN
INTRAMUSCULAR | Status: DC | PRN
  Administered 2021-06-13: 2 mg via INTRAVENOUS

## 2021-06-13 MED ORDER — CEFAZOLIN SODIUM-DEXTROSE 2-3 GM-%(50ML) IV SOLR
INTRAVENOUS | Status: DC | PRN
  Administered 2021-06-13: 2 g via INTRAVENOUS

## 2021-06-13 MED ORDER — ONDANSETRON HCL 4 MG/2ML IJ SOLN
4.0000 mg | Freq: Four times a day (QID) | INTRAMUSCULAR | Status: DC | PRN
  Administered 2021-06-13: 4 mg via INTRAVENOUS
  Filled 2021-06-13: qty 2

## 2021-06-13 MED ORDER — SCOPOLAMINE 1 MG/3DAYS TD PT72
MEDICATED_PATCH | TRANSDERMAL | Status: DC | PRN
  Administered 2021-06-13: 1 via TRANSDERMAL

## 2021-06-13 MED ORDER — LIDOCAINE 2% (20 MG/ML) 5 ML SYRINGE
INTRAMUSCULAR | Status: DC | PRN
  Administered 2021-06-13: 40 mg via INTRAVENOUS

## 2021-06-13 MED ORDER — MIDAZOLAM HCL 2 MG/2ML IJ SOLN
INTRAMUSCULAR | Status: AC
  Filled 2021-06-13: qty 2

## 2021-06-13 MED ORDER — BUPIVACAINE-EPINEPHRINE 0.25% -1:200000 IJ SOLN
INTRAMUSCULAR | Status: DC | PRN
  Administered 2021-06-13: 20 mL

## 2021-06-13 MED ORDER — SODIUM CHLORIDE 0.9 % IV SOLN
INTRAVENOUS | Status: DC | PRN
  Administered 2021-06-13: 20 mL

## 2021-06-13 MED ORDER — ACETAMINOPHEN 500 MG PO TABS: 1000.0000 mg | ORAL_TABLET | ORAL | Status: AC

## 2021-06-13 MED ORDER — CHLORHEXIDINE GLUCONATE 0.12 % MT SOLN: 15.0000 mL | Freq: Once | OROMUCOSAL | Status: AC

## 2021-06-13 MED ORDER — SODIUM CHLORIDE 0.9 % IV SOLN
2.0000 g | INTRAVENOUS | Status: DC
  Administered 2021-06-13 – 2021-06-14 (×2): 2 g via INTRAVENOUS
  Filled 2021-06-13 (×2): qty 20

## 2021-06-13 MED ORDER — FENTANYL CITRATE (PF) 100 MCG/2ML IJ SOLN
25.0000 ug | INTRAMUSCULAR | Status: AC | PRN
  Administered 2021-06-13 (×6): 25 ug via INTRAVENOUS

## 2021-06-13 MED ORDER — ACETAMINOPHEN 325 MG PO TABS
650.0000 mg | ORAL_TABLET | Freq: Four times a day (QID) | ORAL | Status: DC | PRN
  Administered 2021-06-13: 650 mg via ORAL
  Filled 2021-06-13: qty 2

## 2021-06-13 MED ORDER — FENTANYL CITRATE (PF) 250 MCG/5ML IJ SOLN
INTRAMUSCULAR | Status: DC | PRN
  Administered 2021-06-13 (×5): 50 ug via INTRAVENOUS

## 2021-06-13 MED ORDER — SODIUM CHLORIDE 0.9 % IV SOLN: INTRAVENOUS | Status: DC

## 2021-06-13 SURGICAL SUPPLY — 36 items
APPLIER CLIP 5 13 M/L LIGAMAX5 (MISCELLANEOUS) ×2
BAG COUNTER SPONGE SURGICOUNT (BAG) ×2 IMPLANT
BLADE CLIPPER SURG (BLADE) IMPLANT
CANISTER SUCT 3000ML PPV (MISCELLANEOUS) ×2 IMPLANT
CHLORAPREP W/TINT 26 (MISCELLANEOUS) ×2 IMPLANT
CLIP APPLIE 5 13 M/L LIGAMAX5 (MISCELLANEOUS) ×1 IMPLANT
COVER MAYO STAND STRL (DRAPES) IMPLANT
COVER SURGICAL LIGHT HANDLE (MISCELLANEOUS) ×2 IMPLANT
DERMABOND ADVANCED (GAUZE/BANDAGES/DRESSINGS) ×1
DERMABOND ADVANCED .7 DNX12 (GAUZE/BANDAGES/DRESSINGS) ×1 IMPLANT
DRAPE C-ARM 42X120 X-RAY (DRAPES) ×2 IMPLANT
ELECT REM PT RETURN 9FT ADLT (ELECTROSURGICAL) ×2
ELECTRODE REM PT RTRN 9FT ADLT (ELECTROSURGICAL) ×1 IMPLANT
GLOVE SURG SIGNA 7.5 PF LTX (GLOVE) ×2 IMPLANT
GOWN STRL REUS W/ TWL LRG LVL3 (GOWN DISPOSABLE) ×2 IMPLANT
GOWN STRL REUS W/ TWL XL LVL3 (GOWN DISPOSABLE) ×1 IMPLANT
GOWN STRL REUS W/TWL LRG LVL3 (GOWN DISPOSABLE) ×2
GOWN STRL REUS W/TWL XL LVL3 (GOWN DISPOSABLE) ×1
KIT BASIN OR (CUSTOM PROCEDURE TRAY) ×2 IMPLANT
KIT TURNOVER KIT B (KITS) ×2 IMPLANT
NS IRRIG 1000ML POUR BTL (IV SOLUTION) ×2 IMPLANT
PAD ARMBOARD 7.5X6 YLW CONV (MISCELLANEOUS) ×2 IMPLANT
POUCH SPECIMEN RETRIEVAL 10MM (ENDOMECHANICALS) ×2 IMPLANT
SCISSORS LAP 5X35 DISP (ENDOMECHANICALS) ×2 IMPLANT
SET CHOLANGIOGRAPH 5 50 .035 (SET/KITS/TRAYS/PACK) ×2 IMPLANT
SET IRRIG TUBING LAPAROSCOPIC (IRRIGATION / IRRIGATOR) ×2 IMPLANT
SET TUBE SMOKE EVAC HIGH FLOW (TUBING) ×2 IMPLANT
SLEEVE ENDOPATH XCEL 5M (ENDOMECHANICALS) ×4 IMPLANT
SPECIMEN JAR SMALL (MISCELLANEOUS) ×2 IMPLANT
SUT MNCRL AB 4-0 PS2 18 (SUTURE) ×2 IMPLANT
TOWEL GREEN STERILE (TOWEL DISPOSABLE) ×2 IMPLANT
TOWEL GREEN STERILE FF (TOWEL DISPOSABLE) ×2 IMPLANT
TRAY LAPAROSCOPIC MC (CUSTOM PROCEDURE TRAY) ×2 IMPLANT
TROCAR XCEL BLUNT TIP 100MML (ENDOMECHANICALS) ×2 IMPLANT
TROCAR XCEL NON-BLD 5MMX100MML (ENDOMECHANICALS) ×2 IMPLANT
WATER STERILE IRR 1000ML POUR (IV SOLUTION) ×2 IMPLANT

## 2021-06-13 NOTE — Op Note (Signed)
Laparoscopic Cholecystectomy with IOC Procedure Note  Indications: This patient presents with symptomatic gallbladder disease and will undergo laparoscopic cholecystectomy.  Pre-operative Diagnosis: symptomatic cholelithiasis  Post-operative Diagnosis: symptomatic cholelithiasis with Choledolcholithiasis  Surgeon: Abigail Miyamoto   Assistants: Aquilla Solian, MD [facility] Resident  Anesthesia: General endotracheal anesthesia  ASA Class: 1  Procedure Details  The patient was seen again in the Holding Room. The risks, benefits, complications, treatment options, and expected outcomes were discussed with the patient. The possibilities of reaction to medication, pulmonary aspiration, perforation of viscus, bleeding, recurrent infection, finding a normal gallbladder, the need for additional procedures, failure to diagnose a condition, the possible need to convert to an open procedure, and creating a complication requiring transfusion or operation were discussed with the patient. The likelihood of improving the patient's symptoms with return to their baseline status is good.  The patient and/or family concurred with the proposed plan, giving informed consent. The site of surgery properly noted. The patient was taken to Operating Room, identified as Frances Jones and the procedure verified as Laparoscopic Cholecystectomy with Intraoperative Cholangiogram. A Time Out was held and the above information confirmed.  Prior to the induction of general anesthesia, antibiotic prophylaxis was administered. General endotracheal anesthesia was then administered and tolerated well. After the induction, the abdomen was prepped with Chloraprep and draped in the sterile fashion. The patient was positioned in the supine position.  Local anesthetic agent was injected into the skin near the umbilicus and an incision made. We dissected down to the abdominal fascia with blunt dissection.  The fascia was incised vertically and we  entered the peritoneal cavity bluntly.  A pursestring suture of 0-Vicryl was placed around the fascial opening.  The Hasson cannula was inserted and secured with the stay suture.  Pneumoperitoneum was then created with CO2 and tolerated well without any adverse changes in the patient's vital signs. A 5-mm port was placed in the subxiphoid position.  Two 5-mm ports were placed in the right upper quadrant. All skin incisions were infiltrated with a local anesthetic agent before making the incision and placing the trocars.   We positioned the patient in reverse Trendelenburg, tilted slightly to the patient's left.  The gallbladder was identified, the fundus grasped and retracted cephalad. Adhesions were lysed bluntly and with the electrocautery where indicated, taking care not to injure any adjacent organs or viscus. The infundibulum was grasped and retracted laterally, exposing the peritoneum overlying the triangle of Calot. This was then divided and exposed in a blunt fashion. A critical view of the cystic duct and cystic artery was obtained.  The cystic duct was clearly identified and bluntly dissected circumferentially. The cystic duct was ligated with a clip distally.   An incision was made in the cystic duct and the [facility] cholangiogram catheter introduced. The catheter was secured using a clip. A cholangiogram was then obtained which showed good visualization of the distal and proximal biliary tree with a filling defect consistent with a stone.  Contrast flowed past the stone into the duodenum. The catheter was then removed.   The cystic duct was then ligated with clips and divided. The cystic artery was identified, dissected free, ligated with clips and divided as well.   The gallbladder was dissected from the liver bed in retrograde fashion with the electrocautery. The gallbladder was removed and placed in an Endocatch sac. The liver bed was irrigated and inspected. Hemostasis was achieved with the  electrocautery. Copious irrigation was utilized and was repeatedly aspirated until clear.  The gallbladder and Endocatch sac were then removed through the umbilical port site.  The pursestring suture was used to close the umbilical fascia.    We again inspected the right upper quadrant for hemostasis.  Pneumoperitoneum was released as we removed the trocars.  4-0 Monocryl was used to close the skin.   Benzoin, steri-strips, and clean dressings were applied. The patient was then extubated and brought to the recovery room in stable condition. Instrument, sponge, and needle counts were correct at closure and at the conclusion of the case.   Findings: Chronic Cholecystitis with Cholelithiasis.  Distal CBD stone seen on cholangiogram  Estimated Blood Loss: Minimal         Drains: 0         Specimens: Gallbladder           Complications: None; patient tolerated the procedure well.         Disposition: PACU - hemodynamically stable.         Condition: stable

## 2021-06-13 NOTE — Progress Notes (Signed)
MD paged, Pt. HR 30's, asymptomatic. EKG done. Updated MD and oncoming nurse.

## 2021-06-13 NOTE — Plan of Care (Signed)

## 2021-06-13 NOTE — Consult Note (Addendum)
[facility] Consult: 12:34 PM 06/13/2021  LOS: 0 days    Referring Provider: Dr Ninfa Linden  Primary Care Physician:  Everlean Patterson Primary Gastroenterologist:  unassigned    Reason for Consultation: Filling defect/CBD stone on intraoperative cholangiogram.   HPI: Frances Jones is a 25 y.o. female.  PMH asthma.  GERD.  Gallstones.  Presented to the ED last night around 8:30 PM.  C/O RUQ pain, worse after eating.  Nausea, nonbloody vomiting, chills.  Symptoms started 2 or 3 weeks ago.  Good relief alternating between Tylenol and ibuprofen.  2 or 3 years ago had similar symptoms and was told she had gallstones.  Symptoms abated with dietary modification  T bili 2.  Alk phos 77.  AST/ALT 711/489. Abdominal ultrasound:  Cholelithiasis, GB wall thickening.  Normal liver and Doppler studies   Occasional nonheavy alcohol use.  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  Lives in [place] with her mother.  Past Medical History:  Diagnosis Date   Acid reflux    Asthma    Migraines     Past Surgical History:  Procedure Laterality Date   TONSILLECTOMY      Prior to Admission medications   Medication Sig Start Date End Date Taking? Authorizing Provider  cyclobenzaprine (FLEXERIL) 10 MG tablet Take 1 tablet (10 mg total) by mouth 2 (two) times daily as needed for muscle spasms. 07/18/17   Carlisle Cater, PA-C  naproxen (NAPROSYN) 500 MG tablet Take 1 tablet (500 mg total) by mouth 2 (two) times daily. 07/18/17   Carlisle Cater, PA-C  pantoprazole (PROTONIX) 20 MG tablet Take 1 tablet (20 mg total) by mouth daily. 07/18/17   Carlisle Cater, PA-C    Scheduled Meds:  fentaNYL       fentaNYL       Infusions:  sodium chloride 100 mL/hr at  06/13/21 0858   ceFAZolin     [MAR Hold] cefTRIAXone (ROCEPHIN)  IV Stopped (06/13/21 0955)   PRN Meds: fentaNYL (SUBLIMAZE) injection, [MAR Hold]  morphine injection, [MAR Hold] ondansetron (ZOFRAN) IV, oxyCODONE **OR** oxyCODONE, promethazine   Allergies as of 06/12/2021   (No Known Allergies)    Family History  Problem Relation Age of Onset   Diabetes Mother    Cancer Other    Stroke Other     Social History   Socioeconomic History   Marital status: Single                   Occupational History   ICU RN  Tobacco Use   Smoking status: Some Days    Types: Cigars   Smokeless tobacco: Never  Substance and Sexual Activity   Alcohol use: Yes    Comment: occ   Drug use: No   REVIEW OF SYSTEMS: Constitutional: Generally has good energy level, no fatigue. ENT:  No nose bleeds Pulm: Shortness of breath, no cough. CV:  No palpitations, no LE edema.  No chest pain, angina. GU:  No hematuria, no frequency.  No dark urine GI: See HPI. Heme: Denies unusual or excessive bleeding or bruising. Transfusions: None. Neuro:  No headaches, no peripheral tingling or numbness.  No seizures, no syncope. Derm:  No itching, no rash or sores.  Endocrine:  No sweats or chills.  No polyuria or dysuria Immunization: Not queried. Travel: Works as a Multimedia programmer, the furthest she is gone lately has been [facility] [employer] in the [place] part of the state.   PHYSICAL EXAM: Vital signs in last 24 hours: Vitals:   06/13/21 1210 06/13/21 1225  BP: 123/72 126/79  Pulse: 66 62  Resp: 15 (!) 22  Temp:    SpO2: 100% 100%   Wt Readings from Last 3 Encounters:  06/13/21 117.9 kg  07/18/17 126.1 kg  07/15/16 111.1 kg    General: Obese, pleasant, resting comfortably and easily awakened.  In the PACU awaiting bed. Head: No facial asymmetry or swelling.  No signs of head trauma. Eyes: No conjunctival pallor or scleral icterus.  EOMI Ears: Not hard of hearing Nose: No congestion or  discharge. Mouth: Excellent dentition.  Tongue midline.  Mucosa pink, moist, clear. Neck: No JVD, no masses, no thyromegaly. Lungs: Clear bilaterally.  No labored breathing.  No cough. Heart: RRR.  No MRG.  S1, S2 present. Abdomen: Soft, nondistended.  Tender without guarding or rebound across upper and mid abdomen.  Glued incision sites benign.  Bowel sounds scant.  Do not appreciate organomegaly, bruits, hernias..   Musc/Skeltl: No joint redness, swelling or gross deformity. Extremities: No CCE. Neurologic: Alert.  Appropriate.  Oriented x3.  Moves all 4 limbs without tremor, strength not tested. Skin: No jaundice, no AVMs, no obvious rashes or sores but dermatologic survey was incomplete. Nodes: No cervical adenopathy Psych: Calm, pleasant, cooperative, fluid speech.  LAB RESULTS: Recent Labs    06/12/21 2114  WBC 6.9  HGB 14.0  HCT 41.7  PLT 268   BMET Lab Results  Component Value Date   NA 136 06/12/2021   NA 141 07/15/2016   NA 141 07/05/2016   K 4.0 06/12/2021   K 4.3 07/15/2016   K 4.0 07/05/2016   CL 104 06/12/2021   CL 110 07/15/2016   CL 105 07/05/2016   CO2 25 06/12/2021   CO2 25 07/15/2016   CO2 24 03/14/2016   GLUCOSE 101 (H) 06/12/2021   GLUCOSE 117 (H) 07/15/2016   GLUCOSE 92 07/05/2016   BUN <5 (L) 06/12/2021   BUN 7 07/15/2016   BUN 6 07/05/2016   CREATININE 0.90 06/12/2021   CREATININE 0.94 07/15/2016   CREATININE 1.00 07/05/2016   CALCIUM 8.9 06/12/2021   CALCIUM 9.1 07/15/2016   CALCIUM 9.1 03/14/2016   LFT Recent Labs    06/12/21 2114  PROT 6.4*  ALBUMIN 3.5  AST 711*  ALT 489*  ALKPHOS 77  BILITOT 2.0*   Lipase     Component Value Date/Time   LIPASE 29 06/12/2021 2114      RADIOLOGY STUDIES: images viewed Gatha Mayer, MD, [facility]  DG Cholangiogram Operative  Result Date: 06/13/2021 CLINICAL DATA:  Intraoperative cholangiogram during laparoscopic cholecystectomy. EXAM: INTRAOPERATIVE CHOLANGIOGRAM FLUOROSCOPY TIME:  25  seconds (8.5 mGy) COMPARISON:  Right upper quadrant abdominal ultrasound-06/13/2021 FINDINGS: Intraoperative cholangiographic images of the right upper abdominal quadrant during laparoscopic cholecystectomy are provided for review. Surgical clips overlie the expected location of the gallbladder fossa. Contrast injection demonstrates selective cannulation of the central aspect of the cystic duct. There is passage of contrast through the central  aspect of the cystic duct with filling of a non dilated common bile duct. There is passage of contrast though the CBD and into the descending portion of the duodenum. There is minimal reflux of injected contrast into the common hepatic duct and central aspect of the non dilated intrahepatic biliary system. There is a persistent nonocclusive filling defect in the distal aspect of the CBD worrisome for choledocholithiasis (image 79). IMPRESSION: Persistent nonocclusive filling defect within the distal aspect of the CBD worrisome for choledocholithiasis. Further evaluation and management with ERCP could be performed as indicated. Critical Value/emergent results was discussed with at the time of interpretation on 06/13/2021 at 11:39 am to provider [facility] . Electronically Signed   By: Sandi Mariscal M.D.   On: 06/13/2021 11:40   US Abdomen Limited RUQ (LIVER/GB)  Result Date: 06/13/2021 CLINICAL DATA:  Elevated transaminase levels EXAM: ULTRASOUND ABDOMEN LIMITED RIGHT UPPER QUADRANT COMPARISON:  07/06/2018 FINDINGS: Gallbladder: Multiple stones within the gallbladder, the largest of which measures up to 1.9 cm. Thickening of the gallbladder wall however which measures up to 4 mm, previously 5 mm. No pericholecystic fluid. No sonographic Murphy sign noted by sonographer. Common bile duct: Diameter: 4 mm Liver: No focal lesion identified. Within normal limits in parenchymal echogenicity. Portal vein is patent on color Doppler imaging with normal direction of blood flow  towards the liver. Other: None. IMPRESSION: 1. Cholelithiasis with gallbladder wall thickening but no pericholecystic fluid or positive Murphy sign to suggest acute cholecystitis. Consider HIDA if there is persistent concern for cholecystitis. 2. Otherwise normal right upper quadrant. Electronically Signed   By: Merilyn Baba M.D.   On: 06/13/2021 03:42      IMPRESSION:      Filling defect, suspected CBD stone seen on intraoperative cholangiogram today.  Symptomatic cholelithiasis.  Laparoscopic cholecystectomy this morning with IOC as above.    PLAN:     ERCP planned for tomorrow, timing TBD.  She is receiving Rocephin daily so no additional antibiotics pre- ERCP required.  Can eat what ever surgery plans for her tonight but n.p.o. after midnight.  Discussed procedure using phone-based illustration along with risks, benefits and contingencies with patient.  She understands and is agreeable to proceed.   Azucena Freed  06/13/2021, 12:34 PM Phone [phone]       [place] Attending   I have taken a  history, reviewed the chart and examined the patient. I agree with the Advanced Practitioner's note, impression and recommendations.  Majority the medical decision-making in the formulation of the assessment and plan were performed by me.  She has choledocholithiasis on the IOC. I do not think this is an air bubble.  ERCP with stone extraction is planned for tomorrow.  The risks and benefits as well as alternatives of endoscopic procedure(s) have been discussed and reviewed. All questions answered. The patient agrees to proceed. Rarae but real risk of pancreatitis also reviewed.  Gatha Mayer, MD, [facility] 06/13/2021 2:07 PM

## 2021-06-13 NOTE — ED Notes (Signed)
Pt requesting something for pain at this time

## 2021-06-13 NOTE — ED Provider Notes (Signed)
  Physical Exam  BP (!) 145/82 (BP Location: Left Arm)   Pulse (!) 45   Temp 98.8 F (37.1 C) (Oral)   Resp 18   LMP 05/13/2021 (Approximate)   SpO2 100%   Physical Exam  ED Course/Procedures     Procedures  MDM  25 yo female with choledocholithiasis with elevated lfts.  Seen and cared for by Dr. Madilyn Hook.  Surgery is to see and will advise of dispo.  Surgery saw, assumed care, and noted patient.      Margarita Grizzle, MD 06/19/21 [phone]

## 2021-06-13 NOTE — Anesthesia Procedure Notes (Signed)
Procedure Name: Intubation Date/Time: 06/13/2021 10:43 AM Performed by: Annamary Carolin, CRNA Pre-anesthesia Checklist: Patient identified, Emergency Drugs available, Suction available and Patient being monitored Patient Re-evaluated:Patient Re-evaluated prior to induction Oxygen Delivery Method: Circle System Utilized Preoxygenation: Pre-oxygenation with 100% oxygen Induction Type: IV induction and Rapid sequence Laryngoscope Size: Mac and 3 Grade View: Grade I Tube type: Oral Tube size: 7.0 mm Number of attempts: 1 Airway Equipment and Method: Stylet Placement Confirmation: ETT inserted through vocal cords under direct vision, positive ETCO2 and breath sounds checked- equal and bilateral Secured at: 21 cm Tube secured with: Tape Dental Injury: Teeth and Oropharynx as per pre-operative assessment  Comments: With ease

## 2021-06-13 NOTE — ED Notes (Signed)
Nurse report given to OR nurse

## 2021-06-13 NOTE — ED Notes (Signed)
Brought back to triage. Due to change in VS. And need to be medicated. Provider made aware of current VS

## 2021-06-13 NOTE — Anesthesia Preprocedure Evaluation (Addendum)
Anesthesia Evaluation  Patient identified by MRN, date of birth, ID band Patient awake    Reviewed: Allergy & Precautions, NPO status , Patient's Chart, lab work & pertinent test results  History of Anesthesia Complications Negative for: history of anesthetic complications  Airway Mallampati: II  TM Distance: >3 FB Neck ROM: Full    Dental  (+) Caps, Dental Advisory Given, Teeth Intact   Pulmonary asthma , Current Smoker and Patient abstained from smoking.,    breath sounds clear to auscultation       Cardiovascular negative cardio ROS   Rhythm:Regular Rate:Normal     Neuro/Psych  Headaches, negative psych ROS   GI/Hepatic GERD  Medicated and Poorly Controlled, Elevated LFTs    Endo/Other  Morbid obesity Obesity   Renal/GU negative Renal ROS     Musculoskeletal negative musculoskeletal ROS (+)   Abdominal (+) + obese,   Peds  Hematology negative hematology ROS (+)   Anesthesia Other Findings   Reproductive/Obstetrics                           Anesthesia Physical Anesthesia Plan  ASA: 3  Anesthesia Plan: General   Post-op Pain Management:    Induction: Intravenous  PONV Risk Score and Plan: 4 or greater and Treatment may vary due to age or medical condition, Ondansetron, Scopolamine patch - Pre-op, Midazolam and Dexamethasone  Airway Management Planned: Oral ETT  Additional Equipment: None  Intra-op Plan:   Post-operative Plan: Extubation in OR  Informed Consent: I have reviewed the patients History and Physical, chart, labs and discussed the procedure including the risks, benefits and alternatives for the proposed anesthesia with the patient or authorized representative who has indicated his/her understanding and acceptance.     Dental advisory given  Plan Discussed with: CRNA, Anesthesiologist and Surgeon  Anesthesia Plan Comments:       Anesthesia Quick  Evaluation

## 2021-06-13 NOTE — Transfer of Care (Signed)
Immediate Anesthesia Transfer of Care Note  Patient: Frances Jones  Procedure(s) Performed: LAPAROSCOPIC CHOLECYSTECTOMY WITH INTRAOPERATIVE CHOLANGIOGRAM (Abdomen)  Patient Location: PACU  Anesthesia Type:General  Level of Consciousness: awake, alert , oriented and patient cooperative  Airway & Oxygen Therapy: Patient Spontanous Breathing and Patient connected to face mask oxygen  Post-op Assessment: Report given to RN, Post -op Vital signs reviewed and stable and Patient moving all extremities X 4  Post vital signs: Reviewed and stable  Last Vitals:  Vitals Value Taken Time  BP    Temp    Pulse    Resp    SpO2      Last Pain:  Vitals:   06/13/21 1004  TempSrc: Oral  PainSc:          Complications: No notable events documented.

## 2021-06-13 NOTE — ED Notes (Signed)
Patient signed consent for surgery. She changed into hospital gown at this time and pain meds given.

## 2021-06-13 NOTE — Progress Notes (Addendum)
17:23: paged requesting I update patient's code status. Will defer to day team to address.  18:26: Notified by RN that patient's HR is brady to the 30s. SBP low 100s, pt asymptomatic. Pt has history of intermittent bradycardia and was 45bpm on initial eval per H&P, HR around 40 much of the afternoon today. Will check EKG, continue to monitor at this time.   19:02: Notified again by charge RN of above concern. EKG reports sinus brady with nonspecific T wave abnormality. Will check labs and put patient on tele, but continue to recommend observation given adequate perfusion and asymptomatic.

## 2021-06-13 NOTE — ED Provider Notes (Signed)
[facility] EMERGENCY DEPARTMENT Provider Note   CSN: [id number] Arrival date & time: 06/12/21  1952     History Chief Complaint  Patient presents with   Abdominal Pain    Frances Jones is a 25 y.o. female.  The history is provided by the patient and medical records.  Abdominal Pain Frances Jones is a 25 y.o. female who presents to the Emergency Department complaining of abdominal pain. She presents to the ED complaining of several weeks of constant epigastric pain that radiates to her back.  Pain is worse with meals.  At times pain temporarily improves.  Yesterday her pain became significantly more severe with associated nausea and vomiting and she presented to the ED for further evaluation.    No fever, dysuria, sob.  Occasional alcohol and acetaminophen.      Past Medical History:  Diagnosis Date   Acid reflux    Asthma    Migraines     There are no problems to display for this patient.   Past Surgical History:  Procedure Laterality Date   TONSILLECTOMY       OB History   No obstetric history on file.     Family History  Problem Relation Age of Onset   Diabetes Mother    Cancer Other    Stroke Other     Social History   Tobacco Use   Smoking status: Some Days    Types: Cigars   Smokeless tobacco: Never  Substance Use Topics   Alcohol use: Yes    Comment: occ   Drug use: No    Home Medications Prior to Admission medications   Medication Sig Start Date End Date Taking? Authorizing Provider  cyclobenzaprine (FLEXERIL) 10 MG tablet Take 1 tablet (10 mg total) by mouth 2 (two) times daily as needed for muscle spasms. 07/18/17   Renne Crigler, PA-C  naproxen (NAPROSYN) 500 MG tablet Take 1 tablet (500 mg total) by mouth 2 (two) times daily. 07/18/17   Renne Crigler, PA-C  pantoprazole (PROTONIX) 20 MG tablet Take 1 tablet (20 mg total) by mouth daily. 07/18/17   Renne Crigler, PA-C    Allergies    Patient has no known  allergies.  Review of Systems   Review of Systems  Gastrointestinal:  Positive for abdominal pain.  All other systems reviewed and are negative.  Physical Exam Updated Vital Signs BP (!) 151/85 (BP Location: Left Arm)   Pulse (!) 56   Temp 98.6 F (37 C)   Resp 16   LMP 05/13/2021 (Approximate)   SpO2 100%   Physical Exam Vitals and nursing note reviewed.  Constitutional:      Appearance: She is well-developed.  HENT:     Head: Normocephalic and atraumatic.  Cardiovascular:     Rate and Rhythm: Normal rate and regular rhythm.     Heart sounds: No murmur heard. Pulmonary:     Effort: Pulmonary effort is normal. No respiratory distress.     Breath sounds: Normal breath sounds.  Abdominal:     Palpations: Abdomen is soft.     Tenderness: There is no guarding or rebound.     Comments: Moderate epigastric and RUQ tenderness  Musculoskeletal:        General: No tenderness.  Skin:    General: Skin is warm and dry.  Neurological:     Mental Status: She is alert and oriented to person, place, and time.  Psychiatric:        Behavior: Behavior  normal.    ED Results / Procedures / Treatments   Labs (all labs ordered are listed, but only abnormal results are displayed) Labs Reviewed  COMPREHENSIVE METABOLIC PANEL - Abnormal; Notable for the following components:      Result Value   Glucose, Bld 101 (*)    BUN <5 (*)    Total Protein 6.4 (*)    AST 711 (*)    ALT 489 (*)    Total Bilirubin 2.0 (*)    All other components within normal limits  URINALYSIS, ROUTINE W REFLEX MICROSCOPIC - Abnormal; Notable for the following components:   Specific Gravity, Urine <1.005 (*)    Bilirubin Urine SMALL (*)    All other components within normal limits  RESP PANEL BY RT-PCR (FLU A&B, COVID) ARPGX2  LIPASE, BLOOD  CBC  I-STAT BETA HCG BLOOD, ED ([facility], [facility], [facility] ONLY)    EKG None  Radiology US Abdomen Limited RUQ (LIVER/GB)  Result Date: 06/13/2021 CLINICAL DATA:  Elevated  transaminase levels EXAM: ULTRASOUND ABDOMEN LIMITED RIGHT UPPER QUADRANT COMPARISON:  07/06/2018 FINDINGS: Gallbladder: Multiple stones within the gallbladder, the largest of which measures up to 1.9 cm. Thickening of the gallbladder wall however which measures up to 4 mm, previously 5 mm. No pericholecystic fluid. No sonographic Murphy sign noted by sonographer. Common bile duct: Diameter: 4 mm Liver: No focal lesion identified. Within normal limits in parenchymal echogenicity. Portal vein is patent on color Doppler imaging with normal direction of blood flow towards the liver. Other: None. IMPRESSION: 1. Cholelithiasis with gallbladder wall thickening but no pericholecystic fluid or positive Murphy sign to suggest acute cholecystitis. Consider HIDA if there is persistent concern for cholecystitis. 2. Otherwise normal right upper quadrant. Electronically Signed   By: Wiliam Ke M.D.   On: 06/13/2021 03:42    Procedures Procedures   Medications Ordered in ED Medications  lactated ringers bolus 1,000 mL (has no administration in time range)  acetaminophen (TYLENOL) tablet 500 mg (500 mg Oral Given 06/12/21 2046)  ondansetron (ZOFRAN-ODT) disintegrating tablet 4 mg (4 mg Oral Given 06/13/21 0328)  acetaminophen (TYLENOL) tablet 650 mg (650 mg Oral Given 06/13/21 0328)    ED Course  I have reviewed the triage vital signs and the nursing notes.  Pertinent labs & imaging results that were available during my care of the patient were reviewed by me and considered in my medical decision making (see chart for details).    MDM Rules/Calculators/A&P                          patient here for evaluation of several weeks of progressive abdominal pain, now with worsening pain and vomiting. She has tenderness on examination without peritoneal findings. Labs significant for elevation in her transaminases. Lipases within normal limits. Ultrasound demonstrates cholelithiasis, gallbladder wall thickening.  Discussed with general surgery, patient will be evaluated in the ED.  Pt updated of findings of studies and is in agreement with treatment plan.    Final Clinical Impression(s) / ED Diagnoses Final diagnoses:  Elevated transaminase level    Rx / DC Orders ED Discharge Orders     None        Tilden Fossa, MD 06/13/21 [phone]

## 2021-06-13 NOTE — Anesthesia Postprocedure Evaluation (Signed)
Anesthesia Post Note  Patient: Frances Jones  Procedure(s) Performed: LAPAROSCOPIC CHOLECYSTECTOMY WITH INTRAOPERATIVE CHOLANGIOGRAM (Abdomen)     Patient location during evaluation: PACU Anesthesia Type: General Level of consciousness: awake and alert, patient cooperative and oriented Pain management: pain level controlled Vital Signs Assessment: post-procedure vital signs reviewed and stable Respiratory status: spontaneous breathing, nonlabored ventilation and respiratory function stable Cardiovascular status: blood pressure returned to baseline and stable Postop Assessment: no apparent nausea or vomiting Anesthetic complications: no   No notable events documented.  Last Vitals:  Vitals:   06/13/21 1255 06/13/21 1310  BP: 126/79 (!) 132/52  Pulse: (!) 44 (!) 41  Resp: 14 (!) 22  Temp:    SpO2: 100% 100%    Last Pain:  Vitals:   06/13/21 1310  TempSrc:   PainSc: 4                  Argyle Gustafson,E. Evon Dejarnett

## 2021-06-13 NOTE — H&P (View-Only) (Signed)
[facility] Consult: 12:34 PM 06/13/2021  LOS: 0 days    Referring Provider: Dr Ninfa Linden  Primary Care Physician:  Everlean Patterson Primary Gastroenterologist:  unassigned    Reason for Consultation: Filling defect/CBD stone on intraoperative cholangiogram.   HPI: Frances Jones is a 25 y.o. female.  PMH asthma.  GERD.  Gallstones.  Presented to the ED last night around 8:30 PM.  C/O RUQ pain, worse after eating.  Nausea, nonbloody vomiting, chills.  Symptoms started 2 or 3 weeks ago.  Good relief alternating between Tylenol and ibuprofen.  2 or 3 years ago had similar symptoms and was told she had gallstones.  Symptoms abated with dietary modification  T bili 2.  Alk phos 77.  AST/ALT 711/489. Abdominal ultrasound:  Cholelithiasis, GB wall thickening.  Normal liver and Doppler studies   Occasional nonheavy alcohol use.  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  Lives in [place] with her mother.  Past Medical History:  Diagnosis Date   Acid reflux    Asthma    Migraines     Past Surgical History:  Procedure Laterality Date   TONSILLECTOMY      Prior to Admission medications   Medication Sig Start Date End Date Taking? Authorizing Provider  cyclobenzaprine (FLEXERIL) 10 MG tablet Take 1 tablet (10 mg total) by mouth 2 (two) times daily as needed for muscle spasms. 07/18/17   Carlisle Cater, PA-C  naproxen (NAPROSYN) 500 MG tablet Take 1 tablet (500 mg total) by mouth 2 (two) times daily. 07/18/17   Carlisle Cater, PA-C  pantoprazole (PROTONIX) 20 MG tablet Take 1 tablet (20 mg total) by mouth daily. 07/18/17   Carlisle Cater, PA-C    Scheduled Meds:  fentaNYL       fentaNYL       Infusions:  sodium chloride 100 mL/hr at  06/13/21 0858   ceFAZolin     [MAR Hold] cefTRIAXone (ROCEPHIN)  IV Stopped (06/13/21 0955)   PRN Meds: fentaNYL (SUBLIMAZE) injection, [MAR Hold]  morphine injection, [MAR Hold] ondansetron (ZOFRAN) IV, oxyCODONE **OR** oxyCODONE, promethazine   Allergies as of 06/12/2021   (No Known Allergies)    Family History  Problem Relation Age of Onset   Diabetes Mother    Cancer Other    Stroke Other     Social History   Socioeconomic History   Marital status: Single                   Occupational History   ICU RN  Tobacco Use   Smoking status: Some Days    Types: Cigars   Smokeless tobacco: Never  Substance and Sexual Activity   Alcohol use: Yes    Comment: occ   Drug use: No   REVIEW OF SYSTEMS: Constitutional: Generally has good energy level, no fatigue. ENT:  No nose bleeds Pulm: Shortness of breath, no cough. CV:  No palpitations, no LE edema.  No chest pain, angina. GU:  No hematuria, no frequency.  No dark urine GI: See HPI. Heme: Denies unusual or excessive bleeding or bruising. Transfusions: None. Neuro:  No headaches, no peripheral tingling or numbness.  No seizures, no syncope. Derm:  No itching, no rash or sores.  Endocrine:  No sweats or chills.  No polyuria or dysuria Immunization: Not queried. Travel: Works as a Multimedia programmer, the furthest she is gone lately has been [facility] [employer] in the [place] part of the state.   PHYSICAL EXAM: Vital signs in last 24 hours: Vitals:   06/13/21 1210 06/13/21 1225  BP: 123/72 126/79  Pulse: 66 62  Resp: 15 (!) 22  Temp:    SpO2: 100% 100%   Wt Readings from Last 3 Encounters:  06/13/21 117.9 kg  07/18/17 126.1 kg  07/15/16 111.1 kg    General: Obese, pleasant, resting comfortably and easily awakened.  In the PACU awaiting bed. Head: No facial asymmetry or swelling.  No signs of head trauma. Eyes: No conjunctival pallor or scleral icterus.  EOMI Ears: Not hard of hearing Nose: No congestion or  discharge. Mouth: Excellent dentition.  Tongue midline.  Mucosa pink, moist, clear. Neck: No JVD, no masses, no thyromegaly. Lungs: Clear bilaterally.  No labored breathing.  No cough. Heart: RRR.  No MRG.  S1, S2 present. Abdomen: Soft, nondistended.  Tender without guarding or rebound across upper and mid abdomen.  Glued incision sites benign.  Bowel sounds scant.  Do not appreciate organomegaly, bruits, hernias..   Musc/Skeltl: No joint redness, swelling or gross deformity. Extremities: No CCE. Neurologic: Alert.  Appropriate.  Oriented x3.  Moves all 4 limbs without tremor, strength not tested. Skin: No jaundice, no AVMs, no obvious rashes or sores but dermatologic survey was incomplete. Nodes: No cervical adenopathy Psych: Calm, pleasant, cooperative, fluid speech.  LAB RESULTS: Recent Labs    06/12/21 2114  WBC 6.9  HGB 14.0  HCT 41.7  PLT 268   BMET Lab Results  Component Value Date   NA 136 06/12/2021   NA 141 07/15/2016   NA 141 07/05/2016   K 4.0 06/12/2021   K 4.3 07/15/2016   K 4.0 07/05/2016   CL 104 06/12/2021   CL 110 07/15/2016   CL 105 07/05/2016   CO2 25 06/12/2021   CO2 25 07/15/2016   CO2 24 03/14/2016   GLUCOSE 101 (H) 06/12/2021   GLUCOSE 117 (H) 07/15/2016   GLUCOSE 92 07/05/2016   BUN <5 (L) 06/12/2021   BUN 7 07/15/2016   BUN 6 07/05/2016   CREATININE 0.90 06/12/2021   CREATININE 0.94 07/15/2016   CREATININE 1.00 07/05/2016   CALCIUM 8.9 06/12/2021   CALCIUM 9.1 07/15/2016   CALCIUM 9.1 03/14/2016   LFT Recent Labs    06/12/21 2114  PROT 6.4*  ALBUMIN 3.5  AST 711*  ALT 489*  ALKPHOS 77  BILITOT 2.0*   Lipase     Component Value Date/Time   LIPASE 29 06/12/2021 2114      RADIOLOGY STUDIES: images viewed Gatha Mayer, MD, [facility]  DG Cholangiogram Operative  Result Date: 06/13/2021 CLINICAL DATA:  Intraoperative cholangiogram during laparoscopic cholecystectomy. EXAM: INTRAOPERATIVE CHOLANGIOGRAM FLUOROSCOPY TIME:  25  seconds (8.5 mGy) COMPARISON:  Right upper quadrant abdominal ultrasound-06/13/2021 FINDINGS: Intraoperative cholangiographic images of the right upper abdominal quadrant during laparoscopic cholecystectomy are provided for review. Surgical clips overlie the expected location of the gallbladder fossa. Contrast injection demonstrates selective cannulation of the central aspect of the cystic duct. There is passage of contrast through the central  aspect of the cystic duct with filling of a non dilated common bile duct. There is passage of contrast though the CBD and into the descending portion of the duodenum. There is minimal reflux of injected contrast into the common hepatic duct and central aspect of the non dilated intrahepatic biliary system. There is a persistent nonocclusive filling defect in the distal aspect of the CBD worrisome for choledocholithiasis (image 79). IMPRESSION: Persistent nonocclusive filling defect within the distal aspect of the CBD worrisome for choledocholithiasis. Further evaluation and management with ERCP could be performed as indicated. Critical Value/emergent results was discussed with at the time of interpretation on 06/13/2021 at 11:39 am to provider [facility] . Electronically Signed   By: Sandi Mariscal M.D.   On: 06/13/2021 11:40   US Abdomen Limited RUQ (LIVER/GB)  Result Date: 06/13/2021 CLINICAL DATA:  Elevated transaminase levels EXAM: ULTRASOUND ABDOMEN LIMITED RIGHT UPPER QUADRANT COMPARISON:  07/06/2018 FINDINGS: Gallbladder: Multiple stones within the gallbladder, the largest of which measures up to 1.9 cm. Thickening of the gallbladder wall however which measures up to 4 mm, previously 5 mm. No pericholecystic fluid. No sonographic Murphy sign noted by sonographer. Common bile duct: Diameter: 4 mm Liver: No focal lesion identified. Within normal limits in parenchymal echogenicity. Portal vein is patent on color Doppler imaging with normal direction of blood flow  towards the liver. Other: None. IMPRESSION: 1. Cholelithiasis with gallbladder wall thickening but no pericholecystic fluid or positive Murphy sign to suggest acute cholecystitis. Consider HIDA if there is persistent concern for cholecystitis. 2. Otherwise normal right upper quadrant. Electronically Signed   By: Merilyn Baba M.D.   On: 06/13/2021 03:42      IMPRESSION:      Filling defect, suspected CBD stone seen on intraoperative cholangiogram today.  Symptomatic cholelithiasis.  Laparoscopic cholecystectomy this morning with IOC as above.    PLAN:     ERCP planned for tomorrow, timing TBD.  She is receiving Rocephin daily so no additional antibiotics pre- ERCP required.  Can eat what ever surgery plans for her tonight but n.p.o. after midnight.  Discussed procedure using phone-based illustration along with risks, benefits and contingencies with patient.  She understands and is agreeable to proceed.   Azucena Freed  06/13/2021, 12:34 PM Phone [phone]       [place] Attending   I have taken a  history, reviewed the chart and examined the patient. I agree with the Advanced Practitioner's note, impression and recommendations.  Majority the medical decision-making in the formulation of the assessment and plan were performed by me.  She has choledocholithiasis on the IOC. I do not think this is an air bubble.  ERCP with stone extraction is planned for tomorrow.  The risks and benefits as well as alternatives of endoscopic procedure(s) have been discussed and reviewed. All questions answered. The patient agrees to proceed. Rarae but real risk of pancreatitis also reviewed.  Gatha Mayer, MD, [facility] 06/13/2021 2:07 PM

## 2021-06-13 NOTE — H&P (Signed)
Wyatt Portela February 26, 1996  [id number].    Requesting MD: Dr. Jeanell Sparrow Chief Complaint/Reason for Consult: Cholecystitis   HPI: Enis Leatherwood is a 25 y.o. female w/ a hx of GERD and asthma who presented to the ED for abdominal pain.  Patient reports that over the last 2-3 weeks she has been having mild, constant epigastric and right upper quadrant abdominal pain with radiation to her back.  Her pain is worse after eating.  She has been alternating between Tylenol and ibuprofen with good pain relief.  She reports this is similar to the pain she had 2-3 years ago when she was dx with gallstones.  After her initial diagnosis of cholelithiasis she made dietary modifications with good results until her symptom onset 2 to 3 weeks ago.  She reports yesterday she woke up around 6 AM and her epigastric/right upper quadrant abdominal pain had become severe and associated with chills as well as nausea and vomiting.  She denies any fevers.  No current chest pain or shortness of breath.  She underwent work-up in the ED and was found to have normal WBC and lipase, elevated LFTs (AST 711, ALT 49, alk phos 77, T bili 2.0).  RUQ [place] with cholelithiasis with gallbladder wall thickening.  No pericholecystic fluid.  CBD 4 mm.  She reports her pain is improved to a 4/10 after IV pain medication in the ED.  Patient is a ICU RN.  She reports daily marijuana use.  Occasional black and mild tobacco use.  Occasional alcohol use with the last on Monday evening, 1 glass.  She denies daily alcohol use.  Patient lives at home with her mother.  No prior abdominal surgeries.  She is not any blood thinners.  She reports that she had hives after anesthesia for tonsillectomy when she was a child.  She is unsure what the allergy exactly was 2.  No other known allergies.  ROS: Review of Systems  Constitutional:  Positive for chills. Negative for diaphoresis and fever.  Gastrointestinal:  Positive for abdominal pain, nausea and vomiting.  All  other systems reviewed and are negative.  Family History  Problem Relation Age of Onset   Diabetes Mother    Cancer Other    Stroke Other     Past Medical History:  Diagnosis Date   Acid reflux    Asthma    Migraines     Past Surgical History:  Procedure Laterality Date   TONSILLECTOMY      Social History:  reports that she has been smoking cigars. She has never used smokeless tobacco. She reports current alcohol use. She reports that she does not use drugs.  Allergies: No Known Allergies  (Not in a hospital admission)    Physical Exam: Blood pressure (!) 145/82, pulse (!) 45, temperature 98.8 F (37.1 C), temperature source Oral, resp. rate 18, last menstrual period 05/13/2021, SpO2 100 %. General: pleasant, WD/WN AA female who is laying in bed in NAD HEENT: head is normocephalic, atraumatic.  Sclera are noninjected.  PERRL.  Ears and nose without any masses or lesions.  Mouth is pink and moist. Dentition fair Heart: regular, rate, and rhythm.  Normal s1,s2. No obvious murmurs, gallops, or rubs noted.  Palpable pedal pulses bilaterally  Lungs: CTAB, no wheezes, rhonchi, or rales noted.  Respiratory effort nonlabored Abd:  Soft, ND, epigastric and RUQ tenderness without peritonitis. Negative Murphy's sign. +BS, no masses, hernias, or organomegaly MS: no BUE/BLE edema, calves soft and nontender Skin: warm  and dry with no masses, lesions, or rashes Psych: A&Ox4 with an appropriate affect Neuro: cranial nerves grossly intact, equal strength in BUE/BLE bilaterally, normal speech, thought process intact, moves all extremities, gait not assessed   Results for orders placed or performed during the hospital encounter of 06/12/21 (from the past 48 hour(s))  Urinalysis, Routine w reflex microscopic Urine, Clean Catch     Status: Abnormal   Collection Time: 06/12/21  8:42 PM  Result Value Ref Range   Color, Urine YELLOW YELLOW   APPearance CLEAR CLEAR   Specific Gravity, Urine  <1.005 (L) 1.005 - 1.030   pH 6.5 5.0 - 8.0   Glucose, UA NEGATIVE NEGATIVE mg/dL   Hgb urine dipstick NEGATIVE NEGATIVE   Bilirubin Urine SMALL (A) NEGATIVE   Ketones, ur NEGATIVE NEGATIVE mg/dL   Protein, ur NEGATIVE NEGATIVE mg/dL   Nitrite NEGATIVE NEGATIVE   Leukocytes,Ua NEGATIVE NEGATIVE    Comment: Microscopic not done on urines with negative protein, blood, leukocytes, nitrite, or glucose < 500 mg/dL. Performed at [facility], [place] [address] [place], [place] [address]   Lipase, blood     Status: None   Collection Time: 06/12/21  9:14 PM  Result Value Ref Range   Lipase 29 11 - 51 U/L    Comment: Performed at [facility] [address] [place], [place] [address]  Comprehensive metabolic panel     Status: Abnormal   Collection Time: 06/12/21  9:14 PM  Result Value Ref Range   Sodium 136 135 - 145 mmol/L   Potassium 4.0 3.5 - 5.1 mmol/L   Chloride 104 98 - 111 mmol/L   CO2 25 22 - 32 mmol/L   Glucose, Bld 101 (H) 70 - 99 mg/dL    Comment: Glucose reference range applies only to samples taken after fasting for at least 8 hours.   BUN <5 (L) 6 - 20 mg/dL   Creatinine, Ser 0.90 0.44 - 1.00 mg/dL   Calcium 8.9 8.9 - 10.3 mg/dL   Total Protein 6.4 (L) 6.5 - 8.1 g/dL   Albumin 3.5 3.5 - 5.0 g/dL   AST 711 (H) 15 - 41 U/L   ALT 489 (H) 0 - 44 U/L   Alkaline Phosphatase 77 38 - 126 U/L   Total Bilirubin 2.0 (H) 0.3 - 1.2 mg/dL   GFR, Estimated >60 >60 mL/min    Comment: (NOTE) Calculated using the CKD-EPI Creatinine Equation (2021)    Anion gap 7 5 - 15    Comment: Performed at [facility], [place] [address] [place], [place] [address]  CBC     Status: None   Collection Time: 06/12/21  9:14 PM  Result Value Ref Range   WBC 6.9 4.0 - 10.5 K/uL   RBC 4.23 3.87 - 5.11 MIL/uL   Hemoglobin 14.0 12.0 - 15.0 g/dL   HCT 41.7 36.0 - 46.0 %   MCV 98.6 80.0 - 100.0 fL   MCH 33.1 26.0 - 34.0 pg   MCHC 33.6 30.0 - 36.0 g/dL   RDW 13.0 11.5 - 15.5 %    Platelets 268 150 - 400 K/uL   nRBC 0.0 0.0 - 0.2 %    Comment: Performed at [facility], [place] [address], [place],  [address]  I-Stat beta hCG blood, ED     Status: None   Collection Time: 06/12/21  9:33 PM  Result Value Ref Range   I-stat hCG, quantitative <5.0 <5 mIU/mL  Comment 3            Comment:   GEST. AGE      CONC.  (mIU/mL)   <=1 WEEK        5 - 50     2 WEEKS       50 - 500     3 WEEKS       100 - 10,000     4 WEEKS     1,000 - 30,000        FEMALE AND NON-PREGNANT FEMALE:     LESS THAN 5 mIU/mL   Resp Panel by RT-PCR (Flu A&B, Covid) Nasopharyngeal Swab     Status: None   Collection Time: 06/13/21  6:14 AM   Specimen: Nasopharyngeal Swab; Nasopharyngeal(NP) swabs in vial transport medium  Result Value Ref Range   SARS Coronavirus 2 by RT PCR NEGATIVE NEGATIVE    Comment: (NOTE) SARS-CoV-2 target nucleic acids are NOT DETECTED.  The SARS-CoV-2 RNA is generally detectable in upper respiratory specimens during the acute phase of infection. The lowest concentration of SARS-CoV-2 viral copies this assay can detect is 138 copies/mL. A negative result does not preclude SARS-Cov-2 infection and should not be used as the sole basis for treatment or other patient management decisions. A negative result may occur with  improper specimen collection/handling, submission of specimen other than nasopharyngeal swab, presence of viral mutation(s) within the areas targeted by this assay, and inadequate number of viral copies(<138 copies/mL). A negative result must be combined with clinical observations, patient history, and epidemiological information. The expected result is Negative.  Fact Sheet for Patients:  EntrepreneurPulse.com.au  Fact Sheet for Healthcare Providers:  IncredibleEmployment.be  This test is no t yet approved or cleared by the [place] FDA and  has been authorized for detection and/or diagnosis of  SARS-CoV-2 by FDA under an Emergency Use Authorization (EUA). This EUA will remain  in effect (meaning this test can be used) for the duration of the COVID-19 declaration under Section 564(b)(1) of the Act, 21 U.S.C.section 360bbb-3(b)(1), unless the authorization is terminated  or revoked sooner.       Influenza A by PCR NEGATIVE NEGATIVE   Influenza B by PCR NEGATIVE NEGATIVE    Comment: (NOTE) The Xpert Xpress SARS-CoV-2/FLU/RSV plus assay is intended as an aid in the diagnosis of influenza from Nasopharyngeal swab specimens and should not be used as a sole basis for treatment. Nasal washings and aspirates are unacceptable for Xpert Xpress SARS-CoV-2/FLU/RSV testing.  Fact Sheet for Patients: EntrepreneurPulse.com.au  Fact Sheet for Healthcare Providers: IncredibleEmployment.be  This test is not yet approved or cleared by the [place] FDA and has been authorized for detection and/or diagnosis of SARS-CoV-2 by FDA under an Emergency Use Authorization (EUA). This EUA will remain in effect (meaning this test can be used) for the duration of the COVID-19 declaration under Section 564(b)(1) of the Act, 21 U.S.C. section 360bbb-3(b)(1), unless the authorization is terminated or revoked.  Performed at [facility], [place] [address], [place],  [address]    US Abdomen Limited RUQ (LIVER/GB)  Result Date: 06/13/2021 CLINICAL DATA:  Elevated transaminase levels EXAM: ULTRASOUND ABDOMEN LIMITED RIGHT UPPER QUADRANT COMPARISON:  07/06/2018 FINDINGS: Gallbladder: Multiple stones within the gallbladder, the largest of which measures up to 1.9 cm. Thickening of the gallbladder wall however which measures up to 4 mm, previously 5 mm. No pericholecystic fluid. No sonographic Murphy sign noted by sonographer. Common bile duct: Diameter: 4 mm Liver:  No focal lesion identified. Within normal limits in parenchymal echogenicity. Portal vein is  patent on color Doppler imaging with normal direction of blood flow towards the liver. Other: None. IMPRESSION: 1. Cholelithiasis with gallbladder wall thickening but no pericholecystic fluid or positive Murphy sign to suggest acute cholecystitis. Consider HIDA if there is persistent concern for cholecystitis. 2. Otherwise normal right upper quadrant. Electronically Signed   By: Merilyn Baba M.D.   On: 06/13/2021 03:42    Anti-infectives (From admission, onward)    None       Assessment/Plan Acute Cholecystitis  - Patients hx, exam, labs and imaging are consistent with developing acute cholecystitis. Will start on IV abx. Discussed operative vs nonoperative intervention. I have explained the procedure, risks, and aftercare of Laparoscopic cholecystectomy with IOC including the use of pictures/diagrams. Risks include but are not limited to anesthesia (MI, CVA, death), bleeding, infection, wound problems, hernia, bile leak, injury to common bile duct/liver/intestine and diarrhea post op. We also discussed the possible need for conversion to open and possible need for subtotal cholecystectomy. We discussed if IOC is positive she may require an ERCP. She seems to understand and agrees to proceed. Admit to observation. Possible d/c from PACU.   FEN - NPO, IVF VTE - SCDs ID - Rocephin  Jillyn Ledger, [facility] 06/13/2021, 8:08 AM Please see Amion for pager number during day hours 7:00am-4:30pm

## 2021-06-14 ENCOUNTER — Inpatient Hospital Stay (HOSPITAL_COMMUNITY): Payer: BC Managed Care – PPO

## 2021-06-14 ENCOUNTER — Encounter (HOSPITAL_COMMUNITY): Admission: EM | Disposition: A | Discharge status: Home / Self Care | Payer: Self-pay | Source: Home / Self Care

## 2021-06-14 ENCOUNTER — Inpatient Hospital Stay (HOSPITAL_COMMUNITY): Payer: BC Managed Care – PPO | Admitting: Anesthesiology

## 2021-06-14 ENCOUNTER — Encounter (HOSPITAL_COMMUNITY): Payer: Self-pay | Admitting: Surgery

## 2021-06-14 DIAGNOSIS — K8050 Calculus of bile duct without cholangitis or cholecystitis without obstruction: Secondary | ICD-10-CM | POA: Diagnosis not present

## 2021-06-14 HISTORY — PX: SPHINCTEROTOMY: SHX5544

## 2021-06-14 HISTORY — PX: ENDOSCOPIC RETROGRADE CHOLANGIOPANCREATOGRAPHY (ERCP) WITH PROPOFOL: SHX5810

## 2021-06-14 HISTORY — PX: REMOVAL OF STONES: SHX5545

## 2021-06-14 LAB — CBC
HCT: 37.7 % (ref 36.0–46.0)
Hemoglobin: 12.8 g/dL (ref 12.0–15.0)
MCH: 32.7 pg (ref 26.0–34.0)
MCHC: 34 g/dL (ref 30.0–36.0)
MCV: 96.2 fL (ref 80.0–100.0)
Platelets: 227 10*3/uL (ref 150–400)
RBC: 3.92 MIL/uL (ref 3.87–5.11)
RDW: 12.9 % (ref 11.5–15.5)
WBC: 10.7 10*3/uL — ABNORMAL HIGH (ref 4.0–10.5)
nRBC: 0 % (ref 0.0–0.2)

## 2021-06-14 LAB — COMPREHENSIVE METABOLIC PANEL
ALT: 525 U/L — ABNORMAL HIGH (ref 0–44)
AST: 382 U/L — ABNORMAL HIGH (ref 15–41)
Albumin: 3.2 g/dL — ABNORMAL LOW (ref 3.5–5.0)
Alkaline Phosphatase: 109 U/L (ref 38–126)
Anion gap: 9 (ref 5–15)
BUN: <5 mg/dL — ABNORMAL LOW (ref 6–20)
CO2: 21 mmol/L — ABNORMAL LOW (ref 22–32)
Calcium: 8.7 mg/dL — ABNORMAL LOW (ref 8.9–10.3)
Chloride: 106 mmol/L (ref 98–111)
Creatinine, Ser: 0.83 mg/dL (ref 0.44–1.00)
GFR, Estimated: >60 mL/min (ref >60)
Glucose, Bld: 113 mg/dL — ABNORMAL HIGH (ref 70–99)
Potassium: 3.9 mmol/L (ref 3.5–5.1)
Sodium: 136 mmol/L (ref 135–145)
Total Bilirubin: 2.2 mg/dL — ABNORMAL HIGH (ref 0.3–1.2)
Total Protein: 5.9 g/dL — ABNORMAL LOW (ref 6.5–8.1)

## 2021-06-14 LAB — SURGICAL PATHOLOGY

## 2021-06-14 SURGERY — ENDOSCOPIC RETROGRADE CHOLANGIOPANCREATOGRAPHY (ERCP) WITH PROPOFOL
Anesthesia: General

## 2021-06-14 MED ORDER — SODIUM CHLORIDE 0.9 % IV SOLN
INTRAVENOUS | Status: DC | PRN
  Administered 2021-06-14: 50 mL

## 2021-06-14 MED ORDER — ENOXAPARIN SODIUM 40 MG/0.4ML IJ SOSY
40.0000 mg | PREFILLED_SYRINGE | Freq: Every day | INTRAMUSCULAR | Status: DC
  Filled 2021-06-14: qty 0.4

## 2021-06-14 MED ORDER — LIDOCAINE 2% (20 MG/ML) 5 ML SYRINGE
INTRAMUSCULAR | Status: DC | PRN
  Administered 2021-06-14: 40 mg via INTRAVENOUS
  Administered 2021-06-14: 60 mg via INTRAVENOUS

## 2021-06-14 MED ORDER — SCOPOLAMINE 1 MG/3DAYS TD PT72
MEDICATED_PATCH | TRANSDERMAL | Status: AC
  Filled 2021-06-14: qty 1

## 2021-06-14 MED ORDER — DEXAMETHASONE SODIUM PHOSPHATE 10 MG/ML IJ SOLN
INTRAMUSCULAR | Status: DC | PRN
  Administered 2021-06-14: 10 mg via INTRAVENOUS

## 2021-06-14 MED ORDER — SUGAMMADEX SODIUM 200 MG/2ML IV SOLN
INTRAVENOUS | Status: DC | PRN
  Administered 2021-06-14: 200 mg via INTRAVENOUS
  Administered 2021-06-14: 100 mg via INTRAVENOUS

## 2021-06-14 MED ORDER — PROPOFOL 10 MG/ML IV BOLUS
INTRAVENOUS | Status: DC | PRN
  Administered 2021-06-14: 200 mg via INTRAVENOUS
  Administered 2021-06-14: 10 mg via INTRAVENOUS
  Administered 2021-06-14: 50 mg via INTRAVENOUS

## 2021-06-14 MED ORDER — ROCURONIUM BROMIDE 10 MG/ML (PF) SYRINGE
PREFILLED_SYRINGE | INTRAVENOUS | Status: DC | PRN
  Administered 2021-06-14: 50 mg via INTRAVENOUS

## 2021-06-14 MED ORDER — GLYCOPYRROLATE 0.2 MG/ML IJ SOLN
INTRAMUSCULAR | Status: DC | PRN
  Administered 2021-06-14: .2 mg via INTRAVENOUS

## 2021-06-14 MED ORDER — FENTANYL CITRATE (PF) 250 MCG/5ML IJ SOLN
INTRAMUSCULAR | Status: DC | PRN
  Administered 2021-06-14 (×2): 50 ug via INTRAVENOUS

## 2021-06-14 MED ORDER — MIDAZOLAM HCL 2 MG/2ML IJ SOLN
INTRAMUSCULAR | Status: DC | PRN
  Administered 2021-06-14: 2 mg via INTRAVENOUS

## 2021-06-14 MED ORDER — INDOMETHACIN 50 MG RE SUPP
RECTAL | Status: AC
  Filled 2021-06-14: qty 2

## 2021-06-14 MED ORDER — OXYCODONE HCL 5 MG PO TABS: 5.0000 mg | ORAL_TABLET | Freq: Four times a day (QID) | ORAL | 0 refills | Status: DC | PRN

## 2021-06-14 MED ORDER — INDOMETHACIN 50 MG RE SUPP
RECTAL | Status: DC | PRN
  Administered 2021-06-14: 100 mg via RECTAL

## 2021-06-14 MED ORDER — GLUCAGON HCL RDNA (DIAGNOSTIC) 1 MG IJ SOLR
INTRAMUSCULAR | Status: AC
  Filled 2021-06-14: qty 1

## 2021-06-14 MED ORDER — INDOMETHACIN 50 MG RE SUPP
100.0000 mg | Freq: Once | RECTAL | Status: DC
Start: 2021-06-14 — End: 2021-06-15

## 2021-06-14 MED ORDER — ONDANSETRON HCL 4 MG/2ML IJ SOLN
INTRAMUSCULAR | Status: DC | PRN
  Administered 2021-06-14: 4 mg via INTRAVENOUS

## 2021-06-14 MED ORDER — LACTATED RINGERS IV SOLN: INTRAVENOUS | Status: DC | PRN

## 2021-06-14 MED ORDER — MAGNESIUM SULFATE 50 % IJ SOLN
3.0000 g | Freq: Once | INTRAVENOUS | Status: AC
  Administered 2021-06-14: 3 g via INTRAVENOUS
  Filled 2021-06-14: qty 6

## 2021-06-14 NOTE — Progress Notes (Signed)
Discharge package printed and instructions given to pt. Pt verbalizes understanding. 

## 2021-06-14 NOTE — Plan of Care (Signed)
  Problem: Pain Managment: Goal: General experience of comfort will improve 06/14/2021 0753 by Penelope Galas, RN Outcome: Progressing 06/14/2021 0752 by Penelope Galas, RN Outcome: Progressing

## 2021-06-14 NOTE — Anesthesia Preprocedure Evaluation (Signed)
Anesthesia Evaluation  Patient identified by MRN, date of birth, ID band Patient awake    Reviewed: Allergy & Precautions, NPO status , Patient's Chart, lab work & pertinent test results  History of Anesthesia Complications Negative for: history of anesthetic complications  Airway Mallampati: II  TM Distance: >3 FB Neck ROM: Full    Dental  (+) Caps, Dental Advisory Given, Teeth Intact   Pulmonary asthma , Current Smoker and Patient abstained from smoking.,    breath sounds clear to auscultation       Cardiovascular negative cardio ROS   Rhythm:Regular Rate:Normal     Neuro/Psych  Headaches, negative psych ROS   GI/Hepatic GERD  Medicated and Poorly Controlled, Elevated LFTs    Endo/Other  Morbid obesity Obesity   Renal/GU negative Renal ROS     Musculoskeletal negative musculoskeletal ROS (+)   Abdominal (+) + obese,   Peds  Hematology negative hematology ROS (+)   Anesthesia Other Findings   Reproductive/Obstetrics                           Anesthesia Physical Anesthesia Plan  ASA: 3  Anesthesia Plan: General   Post-op Pain Management:    Induction: Intravenous  PONV Risk Score and Plan: 4 or greater and Treatment may vary due to age or medical condition, Ondansetron, Scopolamine patch - Pre-op, Midazolam and Dexamethasone  Airway Management Planned: Oral ETT  Additional Equipment: None  Intra-op Plan:   Post-operative Plan: Extubation in OR  Informed Consent: I have reviewed the patients History and Physical, chart, labs and discussed the procedure including the risks, benefits and alternatives for the proposed anesthesia with the patient or authorized representative who has indicated his/her understanding and acceptance.     Dental advisory given  Plan Discussed with: CRNA, Anesthesiologist and Surgeon  Anesthesia Plan Comments:       Anesthesia Quick  Evaluation  

## 2021-06-14 NOTE — Transfer of Care (Signed)
Immediate Anesthesia Transfer of Care Note  Patient: Frances Jones  Procedure(s) Performed: ENDOSCOPIC RETROGRADE CHOLANGIOPANCREATOGRAPHY (ERCP) WITH PROPOFOL SPHINCTEROTOMY  Patient Location: PACU  Anesthesia Type:General  Level of Consciousness: patient cooperative and responds to stimulation  Airway & Oxygen Therapy: Patient Spontanous Breathing and Patient connected to nasal cannula oxygen  Post-op Assessment: Report given to RN and Post -op Vital signs reviewed and stable  Post vital signs: Reviewed and stable  Last Vitals:  Vitals Value Taken Time  BP 126/47 06/14/21 1013  Temp    Pulse 81 06/14/21 1013  Resp 14 06/14/21 1013  SpO2 100 % 06/14/21 1013  Vitals shown include unvalidated device data.  Last Pain:  Vitals:   06/14/21 0822  TempSrc: Temporal  PainSc: 8       Patients Stated Pain Goal: 1 (06/13/21 2245)  Complications: No notable events documented.

## 2021-06-14 NOTE — Discharge Summary (Signed)
Patient ID: Frances Jones [id number] 12-Aug-1996 25 y.o.  Admit date: 06/12/2021 Discharge date: 06/14/2021  Admitting Diagnosis: Acute Cholecystitis   Discharge Diagnosis Symptomatic cholelithiasis with Choledocholithiasis  Consultants GI  Reason for Admission: Frances Jones is a 25 y.o. female w/ a hx of GERD and asthma who presented to the ED for abdominal pain.  Patient reports that over the last 2-3 weeks she has been having mild, constant epigastric and right upper quadrant abdominal pain with radiation to her back.  Her pain is worse after eating.  She has been alternating between Tylenol and ibuprofen with good pain relief.  She reports this is similar to the pain she had 2-3 years ago when she was dx with gallstones.  After her initial diagnosis of cholelithiasis she made dietary modifications with good results until her symptom onset 2 to 3 weeks ago.  She reports yesterday she woke up around 6 AM and her epigastric/right upper quadrant abdominal pain had become severe and associated with chills as well as nausea and vomiting.  She denies any fevers.  No current chest pain or shortness of breath.  She underwent work-up in the ED and was found to have normal WBC and lipase, elevated LFTs (AST 711, ALT 49, alk phos 77, T bili 2.0).  RUQ [place] with cholelithiasis with gallbladder wall thickening.  No pericholecystic fluid.  CBD 4 mm.  She reports her pain is improved to a 4/10 after IV pain medication in the ED.   Patient is a ICU RN.  She reports daily marijuana use.  Occasional black and mild tobacco use.  Occasional alcohol use with the last on Monday evening, 1 glass.  She denies daily alcohol use.  Patient lives at home with her mother.  No prior abdominal surgeries.  She is not any blood thinners.  She reports that she had hives after anesthesia for tonsillectomy when she was a child.  She is unsure what the allergy exactly was 2.  No other known allergies.  Procedures Dr. Ninfa Linden -  Laparoscopic Cholecystectomy with [place] - 06/13/2021  Hospital Course:  Patient was admitted for acute cholecystitis. She underwent Lap Chole w/ IOC on 9/21. IOC positive. GI consulted and performed ERCP on 9/22. Patient tolerated the procedure well. On 9/22, the patient was voiding well, tolerating diet, ambulating well, pain well controlled, incisions c/d/i and felt stable for discharge home. Return precautions discussed. A note was provided for work.    Allergies as of 06/14/2021       Reactions   Lactase Nausea And Vomiting   CONSTIPATION        Medication List     STOP taking these medications    cyclobenzaprine 10 MG tablet Commonly known as: FLEXERIL   naproxen 500 MG tablet Commonly known as: NAPROSYN   pantoprazole 20 MG tablet Commonly known as: PROTONIX       TAKE these medications    acetaminophen 500 MG tablet Commonly known as: TYLENOL Take 1,000 mg by mouth every 6 (six) hours as needed for moderate pain.   albuterol 108 (90 Base) MCG/ACT inhaler Commonly known as: VENTOLIN HFA Inhale 2 puffs into the lungs every 6 (six) hours as needed for wheezing or shortness of breath.   ibuprofen 200 MG tablet Commonly known as: ADVIL Take 800 mg by mouth every 6 (six) hours as needed for moderate pain.   oxyCODONE 5 MG immediate release tablet Commonly known as: Oxy IR/ROXICODONE Take 1 tablet (5 mg total) by mouth  every 6 (six) hours as needed for severe pain.          Follow-up [facility], [place]. Go on 07/10/2021.   Specialty: General Surgery Why: Your appointment is 07/10/2021 at 11:00am Please arrive 30 minutes prior to your appointment to check in and fill out paperwork. Bring photo ID and insurance information. Contact information: [address] [place] on 07/06/2021.   Why: Go to any [facility] on 07/06/21 to have blood work done prior to  your appointment at [place]: Frances Jones, [facility] 06/14/2021, 3:53 PM Please see Frances Jones for pager number during day hours 7:00am-4:30pm

## 2021-06-14 NOTE — Progress Notes (Signed)
Patient ID: Frances Jones, female   DOB: [birth year], 25 y.o.   MRN: [id number]  [facility] Surgery Progress Note  Day of Surgery  Subjective: CC-  Had ERCP this am. Doing well. Tolerated cld yesterday and has since coming back from procedure this am. No n/v. Mobilized yesterday. Has not mobilized since ercp. Voiding. No flatus or bm. Having some soreness around her incisions but feel her pain is well controlled on current regimen.    Objective: Vital signs in last 24 hours: Temp:  [97 F (36.1 C)-99.1 F (37.3 C)] 97.7 F (36.5 C) (09/22 1105) Pulse Rate:  [40-85] 47 (09/22 1105) Resp:  [13-24] 21 (09/22 1045) BP: (106-148)/(47-95) 111/62 (09/22 1105) SpO2:  [97 %-100 %] 100 % (09/22 1045) Weight:  [117.9 kg] 117.9 kg (09/22 0822) Last BM Date: 06/13/21  Intake/Output from previous day: 09/21 0701 - 09/22 0700 In: 3232.8 [P.O.:240; I.V.:2792.8; IV Piggyback:200] Out: 325 [Urine:300; Blood:25] Intake/Output this shift: Total I/O In: 500 [I.V.:500] Out: -   PE: Gen:  Alert, NAD, pleasant HEENT: EOM's intact, pupils equal and round Card:  RRR Pulm:  CTAB, no W/R/R, rate and effort normal Abd: Soft, ND, appropriately tender around laparoscopic incisions, +BS. Incisions with glue intact appears well and are without drainage, bleeding, or signs of infection Ext:  no BLE edema  Psych: A&Ox4  Skin: no rashes noted, warm and dry  Lab Results:  Recent Labs    06/13/21 1925 06/14/21 0107  WBC 11.7* 10.7*  HGB 13.9 12.8  HCT 41.9 37.7  PLT 297 227   BMET Recent Labs    06/13/21 1925 06/14/21 0107  NA 136 136  K 4.1 3.9  CL 104 106  CO2 22 21*  GLUCOSE 138* 113*  BUN 5* <5*  CREATININE 0.85 0.83  CALCIUM 9.1 8.7*   PT/INR No results for input(s): LABPROT, INR in the last 72 hours. CMP     Component Value Date/Time   NA 136 06/14/2021 0107   K 3.9 06/14/2021 0107   CL 106 06/14/2021 0107   CO2 21 (L) 06/14/2021 0107   GLUCOSE 113 (H) 06/14/2021 0107    BUN <5 (L) 06/14/2021 0107   CREATININE 0.83 06/14/2021 0107   CALCIUM 8.7 (L) 06/14/2021 0107   PROT 5.9 (L) 06/14/2021 0107   ALBUMIN 3.2 (L) 06/14/2021 0107   AST 382 (H) 06/14/2021 0107   ALT 525 (H) 06/14/2021 0107   ALKPHOS 109 06/14/2021 0107   BILITOT 2.2 (H) 06/14/2021 0107   GFRNONAA >60 06/14/2021 0107   GFRAA >60 07/15/2016 2150   Lipase     Component Value Date/Time   LIPASE 29 06/12/2021 2114       Studies/Results: DG Cholangiogram Operative  Result Date: 06/13/2021 CLINICAL DATA:  Intraoperative cholangiogram during laparoscopic cholecystectomy. EXAM: INTRAOPERATIVE CHOLANGIOGRAM FLUOROSCOPY TIME:  25 seconds (8.5 mGy) COMPARISON:  Right upper quadrant abdominal ultrasound-06/13/2021 FINDINGS: Intraoperative cholangiographic images of the right upper abdominal quadrant during laparoscopic cholecystectomy are provided for review. Surgical clips overlie the expected location of the gallbladder fossa. Contrast injection demonstrates selective cannulation of the central aspect of the cystic duct. There is passage of contrast through the central aspect of the cystic duct with filling of a non dilated common bile duct. There is passage of contrast though the CBD and into the descending portion of the duodenum. There is minimal reflux of injected contrast into the common hepatic duct and central aspect of the non dilated intrahepatic biliary system. There is a persistent nonocclusive filling  defect in the distal aspect of the CBD worrisome for choledocholithiasis (image 79). IMPRESSION: Persistent nonocclusive filling defect within the distal aspect of the CBD worrisome for choledocholithiasis. Further evaluation and management with ERCP could be performed as indicated. Critical Value/emergent results was discussed with at the time of interpretation on 06/13/2021 at 11:39 am to provider [facility] . Electronically Signed   By: Simonne Come M.D.   On: 06/13/2021 11:40   DG  ERCP  Result Date: 06/14/2021 CLINICAL DATA:  25 year old female with a history cholelithiasis EXAM: ERCP TECHNIQUE: Multiple spot images obtained with the fluoroscopic device and submitted for interpretation post-procedure. FLUOROSCOPY TIME:  Fluoroscopy Time:  4 minute 20 seconds COMPARISON:  None. FINDINGS: Limited intraoperative fluoroscopic spot images during ERCP. Initial image demonstrates the endoscope projecting over the upper abdomen with a safety wire in position in the extrahepatic biliary ducts. There is partial opacification of the extrahepatic biliary ducts. Deployment of a retrieval balloon. IMPRESSION: Limited images during ERCP demonstrates deployment of a retrieval balloon in the extrahepatic biliary ducts. Please refer to the dictated operative report for full details of intraoperative findings and procedure. Electronically Signed   By: Gilmer Mor D.O.   On: 06/14/2021 10:21   US Abdomen Limited RUQ (LIVER/GB)  Result Date: 06/13/2021 CLINICAL DATA:  Elevated transaminase levels EXAM: ULTRASOUND ABDOMEN LIMITED RIGHT UPPER QUADRANT COMPARISON:  07/06/2018 FINDINGS: Gallbladder: Multiple stones within the gallbladder, the largest of which measures up to 1.9 cm. Thickening of the gallbladder wall however which measures up to 4 mm, previously 5 mm. No pericholecystic fluid. No sonographic Murphy sign noted by sonographer. Common bile duct: Diameter: 4 mm Liver: No focal lesion identified. Within normal limits in parenchymal echogenicity. Portal vein is patent on color Doppler imaging with normal direction of blood flow towards the liver. Other: None. IMPRESSION: 1. Cholelithiasis with gallbladder wall thickening but no pericholecystic fluid or positive Murphy sign to suggest acute cholecystitis. Consider HIDA if there is persistent concern for cholecystitis. 2. Otherwise normal right upper quadrant. Electronically Signed   By: Wiliam Ke M.D.   On: 06/13/2021 03:42     Anti-infectives: Anti-infectives (From admission, onward)    Start     Dose/Rate Route Frequency Ordered Stop   06/13/21 1016  ceFAZolin (ANCEF) 2-4 GM/100ML-% IVPB       Note to Pharmacy: Samuella Cota   : cabinet override      06/13/21 1016 06/13/21 2229   06/13/21 0830  cefTRIAXone (ROCEPHIN) 2 g in sodium chloride 0.9 % 100 mL IVPB  Status:  Discontinued        2 g 200 mL/hr over 30 Minutes Intravenous Every 24 hours 06/13/21 0824 06/14/21 1054        Assessment/Plan Symptomatic cholelithiasis POD#1 s/p laparoscopic cholecystectomy 9/21 Dr. Magnus Ivan  - IOC with persistent nonocclusive filling defect within the distal aspect of the CBD worrisome for choledocholithiasis S/p ERCP 9/22 Dr. Leone Payor - Cholangiogram NL s/p chole - given abnormal IOC I performed a sphincterotonmy and balloon sweeps that were negative - Per GI continue clear liquids today. Will reach out to see if they are okay advancing diet and see if they are okay with her going home this afternoon.   ID - rocephin periop FEN - CLD VTE - SCDs, lovenox Foley - none Follow up - [facility]  Intermittent bradycardia GERD Obesity BMI 40.72   LOS: 1 day    Franne Forts, [facility] Surgery 06/14/2021, 11:24 AM Please see Amion for  pager number during day hours 7:00am-4:30pm

## 2021-06-14 NOTE — Discharge Instructions (Signed)
[facility]  Please arrive at least 30 min before your appointment to complete your check in paperwork.  If you are unable to arrive 30 min prior to your appointment time we may have to cancel or reschedule you. LAPAROSCOPIC SURGERY: POST OP INSTRUCTIONS Always review your discharge instruction sheet given to you by the facility where your surgery was performed. IF YOU HAVE DISABILITY OR FAMILY LEAVE FORMS, YOU MUST BRING THEM TO THE OFFICE FOR PROCESSING.   DO NOT GIVE THEM TO YOUR DOCTOR.  PAIN CONTROL  First take acetaminophen (Tylenol) AND/or ibuprofen (Advil) to control your pain after surgery.  Follow directions on package.  Taking acetaminophen (Tylenol) and/or ibuprofen (Advil) regularly after surgery will help to control your pain and lower the amount of prescription pain medication you may need.  You should not take more than 4,000 mg (4 grams) of acetaminophen (Tylenol) in 24 hours.  You should not take ibuprofen (Advil), aleve, motrin, naprosyn or other NSAIDS if you have a history of stomach ulcers or chronic kidney disease.  A prescription for pain medication may be given to you upon discharge.  Take your pain medication as prescribed, if you still have uncontrolled pain after taking acetaminophen (Tylenol) or ibuprofen (Advil). Use ice packs to help control pain. If you need a refill on your pain medication, please contact your pharmacy.  They will contact our office to request authorization. Prescriptions will not be filled after 5pm or on week-ends.  HOME MEDICATIONS Take your usually prescribed medications unless otherwise directed.  DIET You should follow a light diet the first few days after arrival home.  Be sure to include lots of fluids daily. Avoid fatty, fried foods.   CONSTIPATION It is common to experience some constipation after surgery and if you are taking pain medication.  Increasing fluid intake and taking a stool softener (such as Colace)  will usually help or prevent this problem from occurring.  A mild laxative (Milk of Magnesia or Miralax) should be taken according to package instructions if there are no bowel movements after 48 hours.  WOUND/INCISION CARE Most patients will experience some swelling and bruising in the area of the incisions.  Ice packs will help.  Swelling and bruising can take several days to resolve.  Unless discharge instructions indicate otherwise, follow guidelines below  STERI-STRIPS - you may remove your outer bandages 48 hours after surgery, and you may shower at that time.  You have steri-strips (small skin tapes) in place directly over the incision.  These strips should be left on the skin for 7-10 days.   DERMABOND/SKIN GLUE - you may shower in 24 hours.  The glue will flake off over the next 2-3 weeks. Any sutures or staples will be removed at the office during your follow-up visit.  ACTIVITIES You may resume regular (light) daily activities beginning the next day--such as daily self-care, walking, climbing stairs--gradually increasing activities as tolerated.  You may have sexual intercourse when it is comfortable.  Refrain from any heavy lifting or straining until approved by your doctor. You may drive when you are no longer taking prescription pain medication, you can comfortably wear a seatbelt, and you can safely maneuver your car and apply brakes.  FOLLOW-UP You should see your doctor in the office for a follow-up appointment approximately 2-3 weeks after your surgery.  You should have been given your post-op/follow-up appointment when your surgery was scheduled.  If you did not receive a post-op/follow-up appointment, make sure   that you call for this appointment within a day or two after you arrive home to insure a convenient appointment time.  OTHER INSTRUCTIONS  WHEN TO CALL YOUR DOCTOR: Fever over 101.0 Inability to urinate Continued bleeding from incision. Increased pain, redness, or  drainage from the incision. Increasing abdominal pain  The clinic staff is available to answer your questions during regular business hours.  Please don't hesitate to call and ask to speak to one of the nurses for clinical concerns.  If you have a medical emergency, go to the nearest emergency room or call 911.  A surgeon from [facility] is always on call at the hospital. [address], [address], [place], [place]  [address] ? [address] [address], [place], [place]   [address] [phone] ? [phone] ? FAX [phone]   

## 2021-06-14 NOTE — Anesthesia Postprocedure Evaluation (Signed)
Anesthesia Post Note  Patient: Frances Jones  Procedure(s) Performed: ENDOSCOPIC RETROGRADE CHOLANGIOPANCREATOGRAPHY (ERCP) WITH PROPOFOL SPHINCTEROTOMY REMOVAL OF STONES     Patient location during evaluation: PACU Anesthesia Type: General Level of consciousness: awake and alert Pain management: pain level controlled Vital Signs Assessment: post-procedure vital signs reviewed and stable Respiratory status: spontaneous breathing, nonlabored ventilation, respiratory function stable and patient connected to nasal cannula oxygen Cardiovascular status: blood pressure returned to baseline and stable Postop Assessment: no apparent nausea or vomiting Anesthetic complications: no   No notable events documented.  Last Vitals:  Vitals:   06/14/21 1045 06/14/21 1105  BP: 135/79 111/62  Pulse: (!) 42 (!) 47  Resp: (!) 21 18  Temp: 37.1 C 36.5 C  SpO2: 100%     Last Pain:  Vitals:   06/14/21 1105  TempSrc: Oral  PainSc:                  Kennieth Rad

## 2021-06-14 NOTE — Plan of Care (Signed)
  Problem: Pain Managment: Goal: General experience of comfort will improve Outcome: Progressing   Problem: Safety: Goal: Ability to remain free from injury will improve Outcome: Progressing   

## 2021-06-14 NOTE — Interval H&P Note (Signed)
History and Physical Interval Note:  06/14/2021 8:41 AM  Frances Jones  has presented today for surgery, with the diagnosis of Choledocholithiasis, filling defect on 06/13/2021 IOC.  Elevated LFTs..  The various methods of treatment have been discussed with the patient and family. After consideration of risks, benefits and other options for treatment, the patient has consented to  Procedure(s): ENDOSCOPIC RETROGRADE CHOLANGIOPANCREATOGRAPHY (ERCP) WITH PROPOFOL (N/A) as a surgical intervention.  The patient's history has been reviewed, patient examined, no change in status, stable for surgery.  I have reviewed the patient's chart and labs.  Questions were answered to the patient's satisfaction.     Stan Head

## 2021-06-14 NOTE — Op Note (Signed)
[facility] Patient Name: Frances Jones Procedure Date : 06/14/2021 MRN: [id number] Attending MD: Iva Boop , MD Date of Birth: Dec 23, 1995 CSN: [id number] Age: 25 Admit Type: Inpatient Procedure:                ERCP Indications:              Filling defect on intraoperative cholangiogram Providers:                Iva Boop, MD, Faythe Casa, RN, Adolph Pollack, RN, Michele Mcalpine Technician Referring MD:              Medicines:                General Anesthesia, Indomethacin 100 mg PR Complications:            No immediate complications. Estimated Blood Loss:     Estimated blood loss: none. Procedure:                Pre-Anesthesia Assessment:                           - Prior to the procedure, a History and Physical                            was performed, and patient medications and                            allergies were reviewed. The patient's tolerance of                            previous anesthesia was also reviewed. The risks                            and benefits of the procedure and the sedation                            options and risks were discussed with the patient.                            All questions were answered, and informed consent                            was obtained. Prior Anticoagulants: The patient has                            taken no previous anticoagulant or antiplatelet                            agents. ASA Grade Assessment: III - A patient with                            severe systemic disease. After reviewing the risks  and benefits, the patient was deemed in                            satisfactory condition to undergo the procedure.                           After obtaining informed consent, the scope was                            passed under direct vision. Throughout the                            procedure, the patient's blood pressure, pulse, and                             oxygen saturations were monitored continuously. The                            [id number]-Q180V ([id number] Olympus duodenoscope was                            introduced through the mouth, and used to inject                            contrast into and used to cannulate the bile duct.                            The ERCP was accomplished without difficulty. The                            patient tolerated the procedure well. Scope In: Scope Out: Findings:      A scout film of the abdomen was obtained. Surgical clips, consistent       with a previous cholecystectomy, were seen in the area of the right       upper quadrant of the abdomen. esophagus not seen well. The stomach and       duodenum were grossly normal. Intitial cannulation was pancreatic duct       w/ wire. Could not enter CBD so left wire in PD. Then able to insert       wire into CBD and remove PD wire. Cholangiogram NL s/p chole - given       abnormal IOC I performed a sphincterotonmy and balloon sweeps that were       negative. Impression:               Normal post-cholecystectomy cholangiogram Recommendation:           - Return patient to hospital ward for ongoing care.                           - Clears only today.                           If well this afternoon may go home. Procedure Code(s):        --- Professional ---  32440, Endoscopic retrograde                            cholangiopancreatography (ERCP); with                            sphincterotomy/papillotomy Diagnosis Code(s):        --- Professional ---                           R93.2, Abnormal findings on diagnostic imaging of                            liver and biliary tract CPT copyright 2019 American Medical Association. All rights reserved. The codes documented in this report are preliminary and upon coder review may  be revised to meet current compliance requirements. Iva Boop, MD 06/14/2021 10:33:05 AM This  report has been signed electronically. Number of Addenda: 0

## 2021-06-14 NOTE — Anesthesia Procedure Notes (Signed)
Procedure Name: Intubation Date/Time: 06/14/2021 8:58 AM Performed by: Betha Loa, CRNA Pre-anesthesia Checklist: Patient identified, Emergency Drugs available, Suction available and Patient being monitored Patient Re-evaluated:Patient Re-evaluated prior to induction Oxygen Delivery Method: Circle System Utilized Preoxygenation: Pre-oxygenation with 100% oxygen Induction Type: IV induction Ventilation: Mask ventilation without difficulty Laryngoscope Size: 3 and Mac Grade View: Grade I Tube type: Oral Tube size: 7.0 mm Number of attempts: 1 Airway Equipment and Method: Stylet and Oral airway Placement Confirmation: ETT inserted through vocal cords under direct vision, positive ETCO2 and breath sounds checked- equal and bilateral Secured at: 21 cm Tube secured with: Tape Dental Injury: Teeth and Oropharynx as per pre-operative assessment

## 2021-06-14 NOTE — Evaluation (Signed)
Physical Therapy Evaluation and Discharge Patient Details Name: Frances Jones MRN: [id number] DOB: 13-Apr-1996 Today's Date: 06/14/2021  History of Present Illness  Pt is a 25 y/o female admitted 9/20 secondary to worsening R abdominal pain. Found to have cholelithiasis and is s/p lap chole on 9/21.  Filling defect noted and pt underwent ERCP on 9/22 which did not have any findings. PMH includes asthma.  Clinical Impression  Patient evaluated by Physical Therapy with no further acute PT needs identified. All education has been completed and the patient has no further questions. Pt overall steady with gait and stair navigation. No overt LOB noted. Educated about walking program and bracing technique to help with pain. Reports her mom can assist at d/c. See below for any follow-up Physical Therapy or equipment needs. PT is signing off. Thank you for this referral. If needs change, please re-consult.         Recommendations for follow up therapy are one component of a multi-disciplinary discharge planning process, led by the attending physician.  Recommendations may be updated based on patient status, additional functional criteria and insurance authorization.  Follow Up Recommendations No PT follow up    Equipment Recommendations  None recommended by PT    Recommendations for Other Services       Precautions / Restrictions Precautions Precautions: None Restrictions Weight Bearing Restrictions: No      Mobility  Bed Mobility Overal bed mobility: Modified Independent             General bed mobility comments: Discussed use of log roll techniuqe for home    Transfers Overall transfer level: Modified independent Equipment used: None             General transfer comment: Increased time secondary to pain  Ambulation/Gait Ambulation/Gait assistance: Modified independent (Device/Increase time) Gait Distance (Feet): 150 Feet Assistive device: None Gait Pattern/deviations:  WFL(Within Functional Limits) Gait velocity: Decreased   General Gait Details: Slower, guarded gait, but overall steady. Educated about walking program to perform at home.  Stairs Stairs: Yes Stairs assistance: Modified independent (Device/Increase time) Stair Management: One rail Right;Step to pattern Number of Stairs: 9 General stair comments: Guarded stair navigation, but no LOB noted. Educated about step to pattern to increase safety.  Wheelchair Mobility    Modified Rankin (Stroke Patients Only)       Balance Overall balance assessment: No apparent balance deficits (not formally assessed)                                           Pertinent Vitals/Pain Pain Assessment: Faces Faces Pain Scale: Hurts little more Pain Location: abdomen Pain Descriptors / Indicators: Guarding;Grimacing Pain Intervention(s): Limited activity within patient's tolerance;Monitored during session;Repositioned    Home Living Family/patient expects to be discharged to:: Private residence Living Arrangements: Parent Available Help at Discharge: Family Type of Home: Apartment Home Access: Stairs to enter Entrance Stairs-Rails: Left;Right;Can reach both Secretary/administrator of Steps: 2 flights Home Layout: One level Home Equipment: None      Prior Function Level of Independence: Independent               Hand Dominance        Extremity/Trunk Assessment   Upper Extremity Assessment Upper Extremity Assessment: Overall WFL for tasks assessed    Lower Extremity Assessment Lower Extremity Assessment: Overall WFL for tasks assessed    Cervical /  Trunk Assessment Cervical / Trunk Assessment: Normal  Communication   Communication: No difficulties  Cognition Arousal/Alertness: Awake/alert Behavior During Therapy: WFL for tasks assessed/performed Overall Cognitive Status: Within Functional Limits for tasks assessed                                         General Comments General comments (skin integrity, edema, etc.): Educated about bracing techniques to help with pain management.    Exercises     Assessment/Plan    PT Assessment Patent does not need any further PT services  PT Problem List         PT Treatment Interventions      PT Goals (Current goals can be found in the Care Plan section)  Acute Rehab PT Goals Patient Stated Goal: to go home PT Goal Formulation: With patient Time For Goal Achievement: 06/14/21 Potential to Achieve Goals: Good    Frequency     Barriers to discharge        Co-evaluation               AM-PAC PT "6 Clicks" Mobility  Outcome Measure Help needed turning from your back to your side while in a flat bed without using bedrails?: None Help needed moving from lying on your back to sitting on the side of a flat bed without using bedrails?: None Help needed moving to and from a bed to a chair (including a wheelchair)?: None Help needed standing up from a chair using your arms (e.g., wheelchair or bedside chair)?: None Help needed to walk in hospital room?: None Help needed climbing 3-5 steps with a railing? : None 6 Click Score: 24    End of Session   Activity Tolerance: Patient tolerated treatment well Patient left: in bed;with call bell/phone within reach Nurse Communication: Mobility status PT Visit Diagnosis: Other abnormalities of gait and mobility (R26.89)    Time: 2595-6387 PT Time Calculation (min) (ACUTE ONLY): 11 min   Charges:   PT Evaluation $PT Eval Low Complexity: 1 Low          Cindee Salt, DPT  Acute Rehabilitation Services  Pager: [phone] Office: [phone]   [facility] 06/14/2021, 4:41 PM

## 2021-06-15 ENCOUNTER — Encounter (HOSPITAL_COMMUNITY): Payer: Self-pay | Admitting: Internal Medicine

## 2022-06-14 IMAGING — US US ABDOMEN LIMITED
1 series · 14 of 25 positions shown · non-contrast
Comparison: 07/06/2018

CLINICAL DATA: Elevated transaminase levels

EXAM:
ULTRASOUND ABDOMEN LIMITED RIGHT UPPER QUADRANT

[Series 1: us abdomen limited ruq (liver/gb) · 14 of 48 slices shown]
[im 1/48]
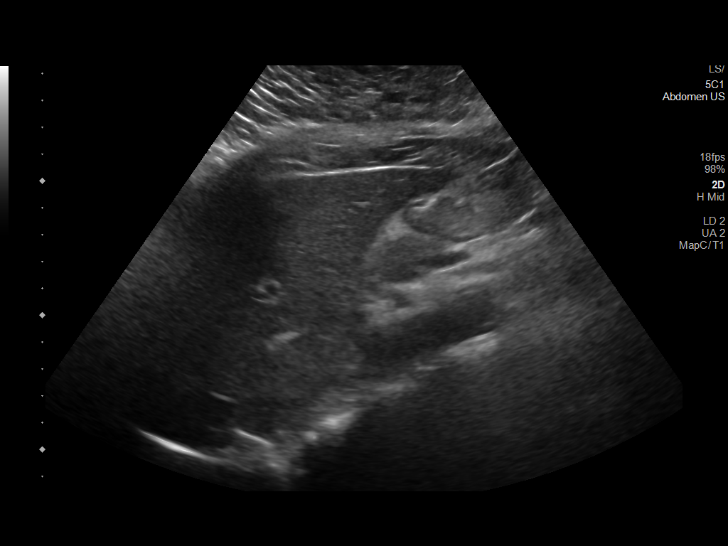
[im 4/48]
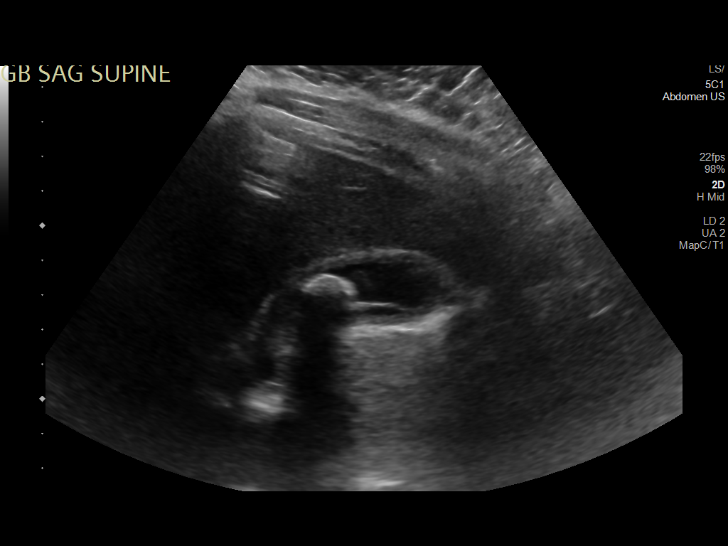
[im 8/48]
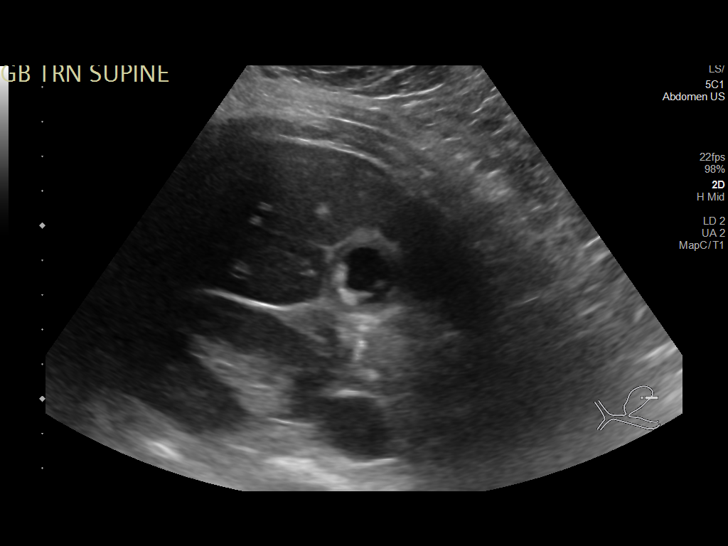
[im 12/48]
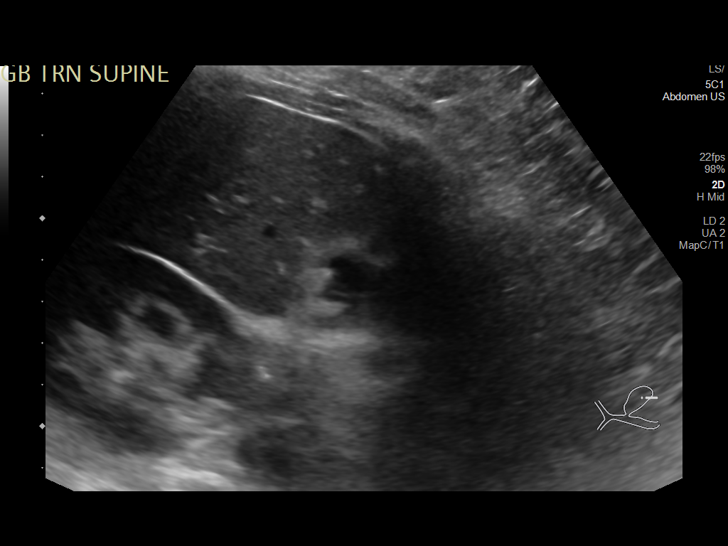
[im 16/48]
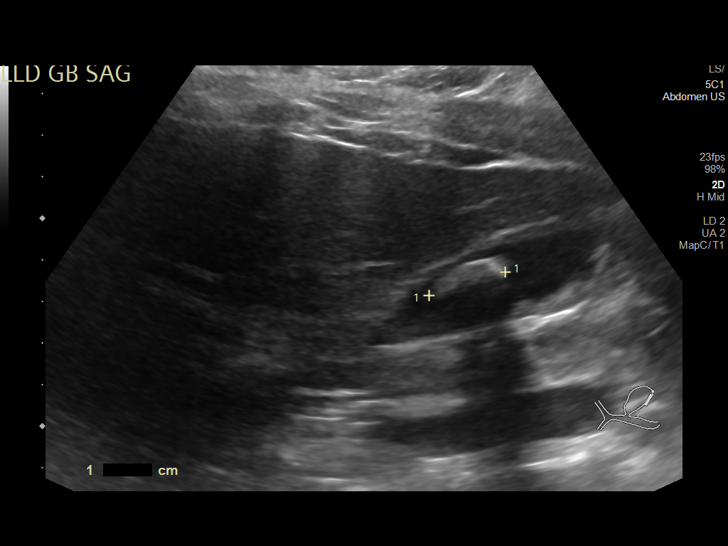
[im 18/48]
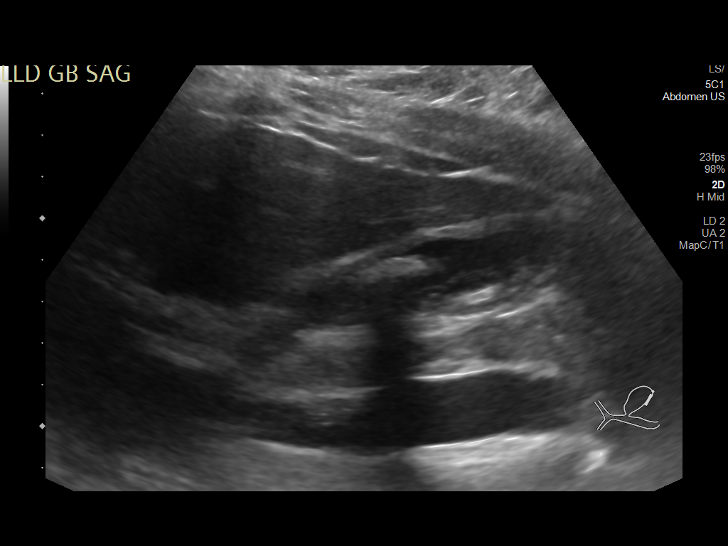
[im 22/48]
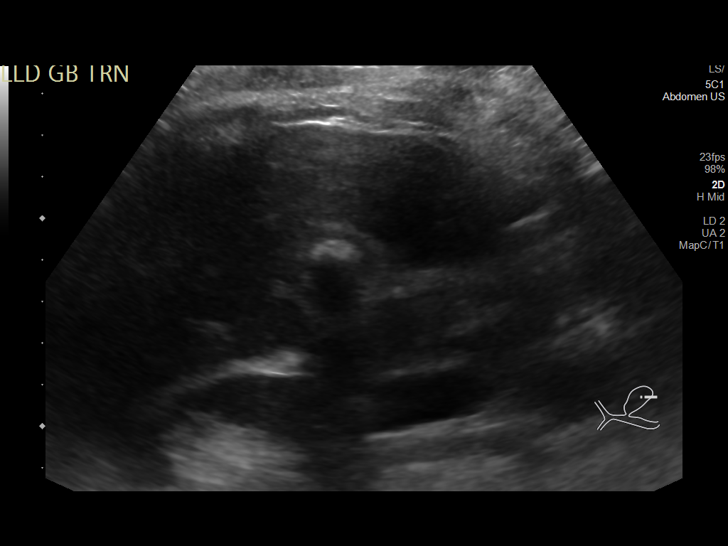
[im 26/48]
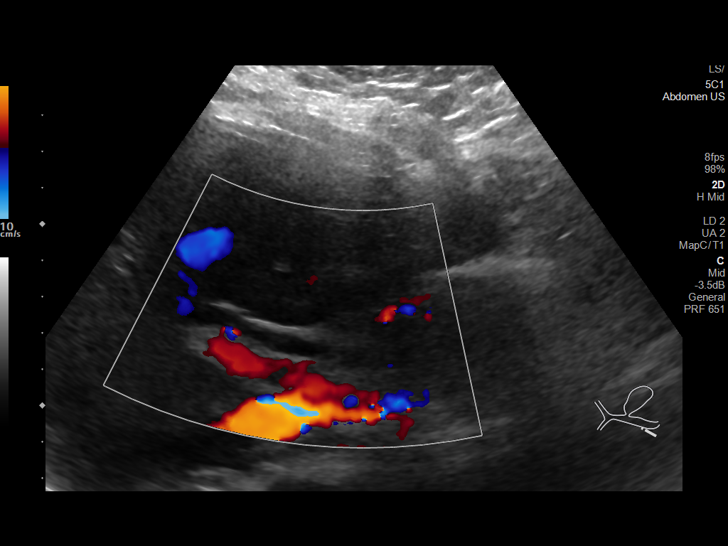
[im 30/48]
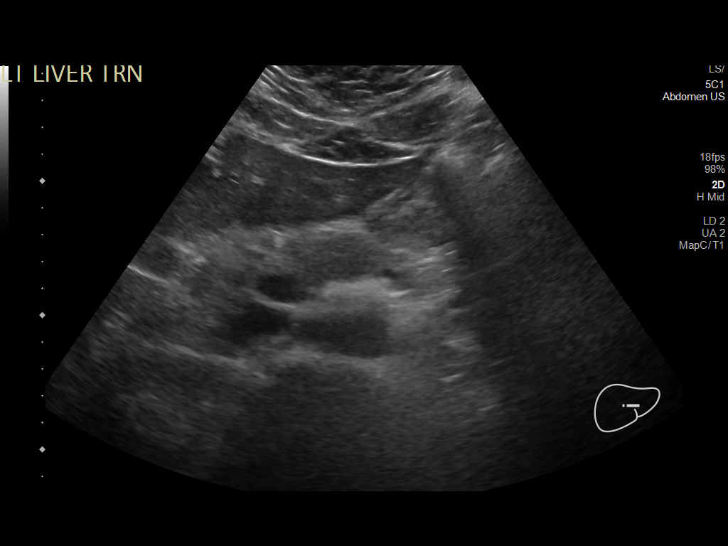
[im 32/48]
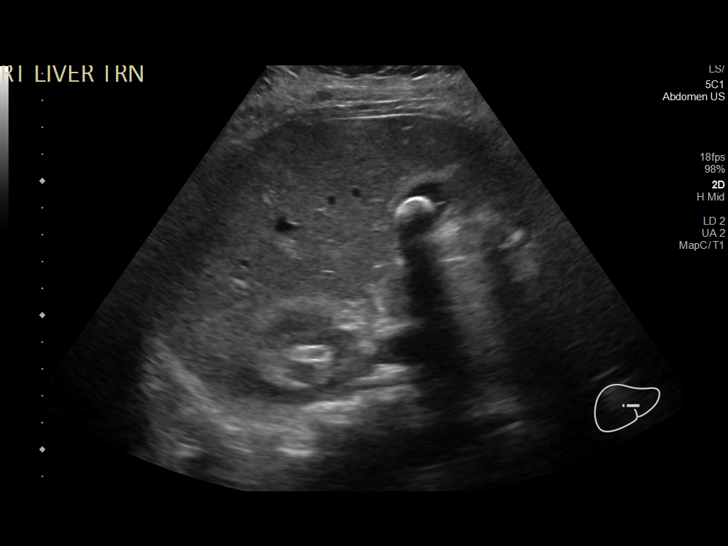
[im 36/48]
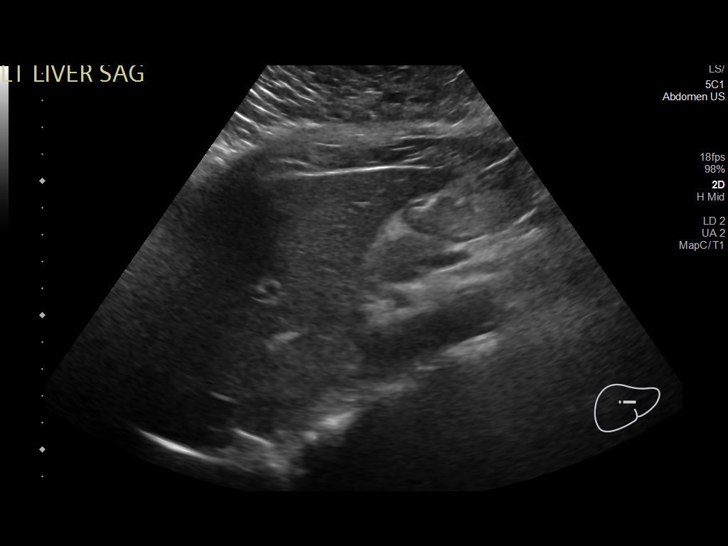
[im 40/48]
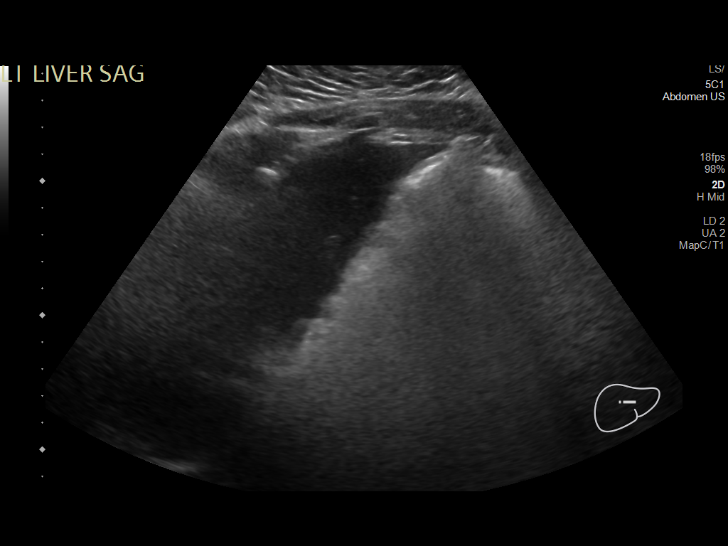
[im 44/48]
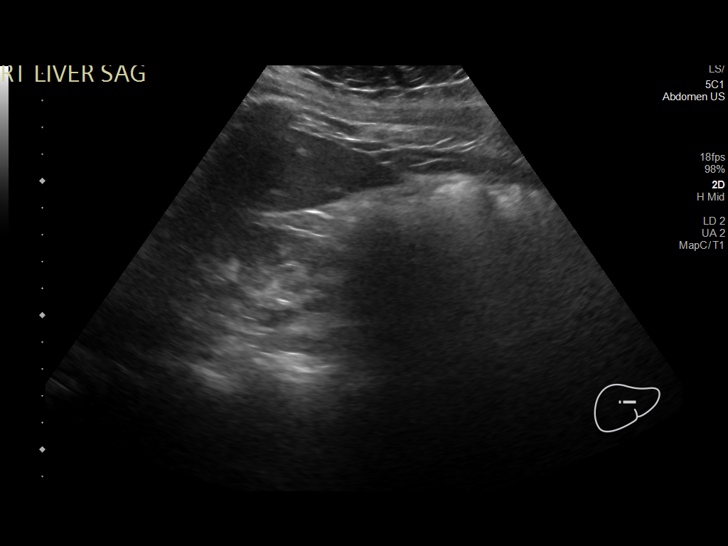
[im 48/48]
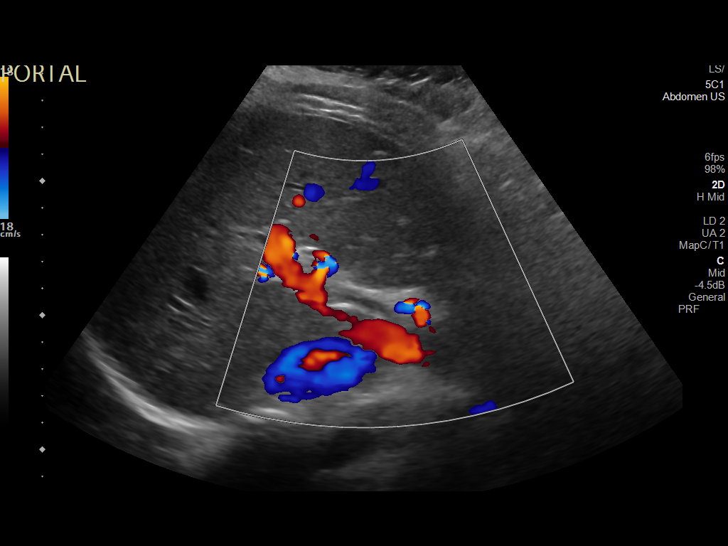

[14 of 25 positions shown; findings below may reference images not displayed]

FINDINGS: Gallbladder:

Multiple stones within the gallbladder, the largest of which
measures up to 1.9 cm. Thickening of the gallbladder wall however
which measures up to 4 mm, previously 5 mm. No pericholecystic
fluid. No sonographic Murphy sign noted by sonographer.

Common bile duct:

Diameter: 4 mm

Liver:

No focal lesion identified. Within normal limits in parenchymal
echogenicity. Portal vein is patent on color Doppler imaging with
normal direction of blood flow towards the liver.

Other: None.
IMPRESSION: 1. Cholelithiasis with gallbladder wall thickening but no
pericholecystic fluid or positive Murphy sign to suggest acute
cholecystitis. Consider HIDA if there is persistent concern for
cholecystitis.
2. Otherwise normal right upper quadrant.

## 2023-10-14 ENCOUNTER — Other Ambulatory Visit: Payer: Self-pay | Admitting: Ophthalmology

## 2023-10-14 ENCOUNTER — Encounter: Payer: Self-pay | Admitting: Ophthalmology

## 2023-10-14 DIAGNOSIS — G932 Benign intracranial hypertension: Secondary | ICD-10-CM

## 2023-11-20 ENCOUNTER — Ambulatory Visit
Admission: RE | Admit: 2023-11-20 | Discharge: 2023-11-20 | Disposition: A | Discharge status: Home / Self Care | Payer: BC Managed Care – PPO | Source: Ambulatory Visit | Attending: Ophthalmology

## 2023-11-20 DIAGNOSIS — G932 Benign intracranial hypertension: Secondary | ICD-10-CM

## 2023-11-20 MED ORDER — GADOPICLENOL 0.5 MMOL/ML IV SOLN
10.0000 mL | Freq: Once | INTRAVENOUS | Status: AC | PRN
  Administered 2023-11-20: 10 mL via INTRAVENOUS

## 2024-01-26 DIAGNOSIS — R609 Edema, unspecified: Secondary | ICD-10-CM | POA: Insufficient documentation

## 2024-01-27 ENCOUNTER — Emergency Department (HOSPITAL_COMMUNITY)

## 2024-01-27 ENCOUNTER — Emergency Department (HOSPITAL_COMMUNITY): Admission: EM | Admit: 2024-01-27 | Discharge: 2024-01-27 | Disposition: A | Discharge status: Home / Self Care

## 2024-01-27 ENCOUNTER — Encounter: Payer: Self-pay | Admitting: Neurology

## 2024-01-27 ENCOUNTER — Ambulatory Visit: Admitting: Neurology

## 2024-01-27 ENCOUNTER — Encounter (HOSPITAL_COMMUNITY): Payer: Self-pay | Admitting: Emergency Medicine

## 2024-01-27 VITALS — BP 177/120 | HR 59 | Ht 67.0 in | Wt 301.0 lb

## 2024-01-27 DIAGNOSIS — Z8669 Personal history of other diseases of the nervous system and sense organs: Secondary | ICD-10-CM

## 2024-01-27 DIAGNOSIS — R079 Chest pain, unspecified: Secondary | ICD-10-CM | POA: Insufficient documentation

## 2024-01-27 DIAGNOSIS — Z6841 Body Mass Index (BMI) 40.0 and over, adult: Secondary | ICD-10-CM

## 2024-01-27 DIAGNOSIS — R030 Elevated blood-pressure reading, without diagnosis of hypertension: Secondary | ICD-10-CM

## 2024-01-27 DIAGNOSIS — Z789 Other specified health status: Secondary | ICD-10-CM

## 2024-01-27 DIAGNOSIS — Z9189 Other specified personal risk factors, not elsewhere classified: Secondary | ICD-10-CM

## 2024-01-27 DIAGNOSIS — R0683 Snoring: Secondary | ICD-10-CM

## 2024-01-27 DIAGNOSIS — I10 Essential (primary) hypertension: Secondary | ICD-10-CM | POA: Insufficient documentation

## 2024-01-27 DIAGNOSIS — R519 Headache, unspecified: Secondary | ICD-10-CM

## 2024-01-27 DIAGNOSIS — I159 Secondary hypertension, unspecified: Secondary | ICD-10-CM

## 2024-01-27 DIAGNOSIS — H4710 Unspecified papilledema: Secondary | ICD-10-CM

## 2024-01-27 DIAGNOSIS — G932 Benign intracranial hypertension: Secondary | ICD-10-CM | POA: Diagnosis not present

## 2024-01-27 DIAGNOSIS — Z820 Family history of epilepsy and other diseases of the nervous system: Secondary | ICD-10-CM

## 2024-01-27 DIAGNOSIS — R609 Edema, unspecified: Secondary | ICD-10-CM

## 2024-01-27 HISTORY — DX: Essential (primary) hypertension: I10

## 2024-01-27 LAB — CBC
HCT: 40 % (ref 36.0–46.0)
Hemoglobin: 13.1 g/dL (ref 12.0–15.0)
MCH: 30.8 pg (ref 26.0–34.0)
MCHC: 32.8 g/dL (ref 30.0–36.0)
MCV: 93.9 fL (ref 80.0–100.0)
Platelets: 333 10*3/uL (ref 150–400)
RBC: 4.26 MIL/uL (ref 3.87–5.11)
RDW: 13.2 % (ref 11.5–15.5)
WBC: 7.5 10*3/uL (ref 4.0–10.5)
nRBC: 0 % (ref 0.0–0.2)

## 2024-01-27 LAB — BASIC METABOLIC PANEL WITH GFR
Anion gap: 9 (ref 5–15)
BUN: 8 mg/dL (ref 6–20)
CO2: 23 mmol/L (ref 22–32)
Calcium: 8.9 mg/dL (ref 8.9–10.3)
Chloride: 106 mmol/L (ref 98–111)
Creatinine, Ser: 0.92 mg/dL (ref 0.44–1.00)
GFR, Estimated: >60 mL/min (ref >60)
Glucose, Bld: 96 mg/dL (ref 70–99)
Potassium: 3.8 mmol/L (ref 3.5–5.1)
Sodium: 138 mmol/L (ref 135–145)

## 2024-01-27 LAB — TROPONIN I (HIGH SENSITIVITY): Troponin I (High Sensitivity): <2 ng/L (ref <18)

## 2024-01-27 LAB — HCG, SERUM, QUALITATIVE: Preg, Serum: NEGATIVE

## 2024-01-27 NOTE — Discharge Instructions (Signed)
 Please follow-up with your cardiologist and your primary doctor.  Return if develop fevers, chills, headache, chest pain, shortness of breath, palpitations or feel your heart is beating abnormal, you pass out, severe pain or you develop any new or worsening symptoms that are concerning to you.

## 2024-01-27 NOTE — ED Provider Notes (Signed)
 [place] EMERGENCY DEPARTMENT AT [facility] Provider Note   CSN: [id number] Arrival date & time: 01/27/24  4098     History  Chief Complaint  Patient presents with   Hypertension    Frances Jones is a 28 y.o. female.  28 year old female presenting emergency department for elevated blood pressure.  Was at neurologist being seen for idiopathic intracranial hypertension, had elevated blood pressure readings and directed to the emergency department.  Was asymptomatic at time, had some slight chest discomfort when checking in here at the emergency department, resolved.  No shortness of breath.  No changes in urination.   Hypertension       Home Medications Prior to Admission medications   Medication Sig Start Date End Date Taking? Authorizing Provider  acetaminophen  (TYLENOL ) 500 MG tablet Take 500-1,000 mg by mouth every 6 (six) hours as needed for moderate pain (pain score 4-6).   Yes [provider]  albuterol  (VENTOLIN  HFA) 108 (90 Base) MCG/ACT inhaler Inhale 2 puffs into the lungs every 6 (six) hours as needed for wheezing or shortness of breath.   Yes [provider]  clindamycin (CLEOCIN T) 1 % external solution Apply 1 Application topically daily.   Yes [provider]  ibuprofen (ADVIL) 200 MG tablet Take 200-800 mg by mouth every 6 (six) hours as needed for moderate pain (pain score 4-6).   Yes [provider]  lamoTRIgine 50 MG TBDP Take 50 mg by mouth at bedtime.   Yes [provider]  lurasidone (LATUDA) 40 MG TABS tablet Take 40 mg by mouth at bedtime.   Yes [provider]  omeprazole (PRILOSEC) 20 MG capsule Take 20 mg by mouth daily as needed (Indigestion).   Yes [provider]  traZODone (DESYREL) 50 MG tablet Take 100 mg by mouth at bedtime. 11/10/23  Yes [provider]  valsartan (DIOVAN) 40 MG tablet Take 40 mg by mouth daily.   Yes [provider]      Allergies     Tilactase    Review of Systems   Review of Systems  Physical Exam Updated Vital Signs BP 125/89   Pulse (!) 51   Temp 98.2 F (36.8 C)   Resp 18   LMP 01/09/2024 (Exact Date)   SpO2 100%  Physical Exam Vitals and nursing note reviewed.  Constitutional:      General: She is not in acute distress.    Appearance: She is obese.  HENT:     Head: Normocephalic.     Mouth/Throat:     Mouth: Mucous membranes are moist.  Eyes:     Conjunctiva/sclera: Conjunctivae normal.  Cardiovascular:     Rate and Rhythm: Normal rate and regular rhythm.  Pulmonary:     Effort: Pulmonary effort is normal.     Breath sounds: Normal breath sounds.  Abdominal:     General: Abdomen is flat. There is no distension.     Tenderness: There is no abdominal tenderness. There is no guarding or rebound.  Musculoskeletal:     Right lower leg: No edema.     Left lower leg: No edema.  Skin:    General: Skin is warm.     Capillary Refill: Capillary refill takes less than 2 seconds.  Neurological:     Mental Status: She is alert and oriented to person, place, and time.  Psychiatric:        Mood and Affect: Mood normal.        Behavior:  Behavior normal.     ED Results / Procedures / Treatments   Labs (all labs ordered are listed, but only abnormal results are displayed) Labs Reviewed  BASIC METABOLIC PANEL WITH GFR  CBC  HCG, SERUM, QUALITATIVE  TROPONIN I (HIGH SENSITIVITY)  TROPONIN I (HIGH SENSITIVITY)    EKG EKG Interpretation Date/Time:  Tuesday Jan 27 2024 09:30:55 EDT Ventricular Rate:  68 PR Interval:  172 QRS Duration:  90 QT Interval:  406 QTC Calculation: 431 R Axis:   -28  Text Interpretation: Normal sinus rhythm with sinus arrhythmia Possible Anterior infarct , age undetermined Abnormal ECG When compared with ECG of 13-Jun-2021 18:36, PREVIOUS ECG IS PRESENT Confirmed by Elise Guile [phone]) on 01/27/2024 11:10:54 AM  Radiology DG Chest 2 View Result Date:  01/27/2024 CLINICAL DATA:  Chest pain. EXAM: CHEST - 2 VIEW COMPARISON:  Jan 22, 2017. FINDINGS: The heart size and mediastinal contours are within normal limits. Both lungs are clear. The visualized skeletal structures are unremarkable. IMPRESSION: No active cardiopulmonary disease. Electronically Signed   By: Rosalene Colon M.D.   On: 01/27/2024 10:17    Procedures Procedures    Medications Ordered in ED Medications - No data to display  ED Course/ Medical Decision Making/ A&P Clinical Course as of 01/27/24 1125  Tue Jan 27, 2024  1120 Patient labs are reassuring.  Troponin negative.  EKG without ST segment changes to indicate ischemia.  Basic metabolic panel with no AKI or kidney injury.  No leukocytosis or anemia.  Chest x-ray without pneumonia or pneumothorax.  Patient requesting to leave.  Not having chest pain.  Discussed utility of repeat troponin and cannot exclude NSTEMI/silent heart attack.  She voiced understanding of the risks.  Competent to make decisions.  She does have good follow-up with cardiology she recently had Holter monitor, echo and CT calcium score and is scheduled for follow-up soon to go over results.  Will discharge at patient's request. [TY]    Clinical Course User Index [TY] Rolinda Climes, DO                                 Medical Decision Making Well-appearing 28 year old female presenting emergency department for elevated blood pressure.  Initial blood pressure 168/108.  Has follow-up with cardiology with recent Holter monitor, echo and CT calcium score.  Unable to review tests in epic.  Per chart review is on valsartan.  Patient's bowels notes blood pressures have been running high at home.  She did have some chest discomfort with checking in today.  Will get basic screening labs to evaluate for endorgan damage plus cardiac etiology for chest pain.  She is low risk for PE based on Wells criteria; low suspicion for PE.  See ED course for further MDM and  disposition.  Amount and/or Complexity of Data Reviewed Labs: ordered. Radiology: ordered.         Final Clinical Impression(s) / ED Diagnoses Final diagnoses:  Secondary hypertension  Chest pain, unspecified type    Rx / DC Orders ED Discharge Orders     None         Rolinda Climes, DO 01/27/24 1125

## 2024-01-27 NOTE — Progress Notes (Signed)
 Subjective:    Patient ID: Frances Jones is a 28 y.o. female.  HPI    Frances Fairy, MD, PhD [facility] [address] [address] [place], [place] [address]  Dear Dr. 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  The patient is accompanied by her wife today.  As you know, Ms. Frances Jones is a 28 year old female with an underlying medical history of asthma, migraines, acid reflux disease, and morbid obesity with a BMI of over 40, who reports recurrent headaches for the past few months.  She also had noticed some intermittent visual blurriness, headache started about December 2024.  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   She denies any sudden onset of one-sided weakness or numbness or tingling or droopy face or slurring of speech.  She uses contact lenses.  She has gained weight over time, but more stable in the last 6 months.  She works as a travel Engineer, civil (consulting), typically in the ICU.  She lives with her wife and wife's 66 year old son. She drinks quite a bit of caffeine in the form of tea and soda, altogether about 3-4 or 5 servings per day on average.  She also drinks an energy drink 3 times a week.  She works nights, 3 12-hour shifts.  She follows with cardiology and had a recent CT scan.  She has had elevated blood pressure values, recently was started on valsartan about 4 to 5 weeks ago per cardiology.  She sees Dr. Frederica Jakob for cardiology and sees a PA for psychiatry, is on Lamictal, Latuda, and trazodone at night.  Her primary care is Dr. Monroe Antigua.  Cardiology records from April 2025 were reviewed.  She had a CT coronary and cardiac morphology test through [facility] on 01/16/2024 and I reviewed the results: IMPRESSION:  1. Noncardiac findings as above.  2. Please see separate dictation by  cardiology regarding cardiac and coronary artery findings.   I reviewed your office note from 10/13/2023.  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  Her optometrist is Dr. Gregoria Leas.   In addition, she had a recent follow-up on 01/18/2024, those records are not available for my review today but patient and her wife state that her findings were stable or her papilledema even slightly improved.  She had a brain MRI with and without contrast, MRI of the orbits with and without contrast and MR venogram of the head without contrast on 11/20/2023 and I reviewed the results:    IMPRESSION: MRI brain:   1.  No evidence of an acute intracranial abnormality. 2. Mildly low-lying left cerebellar tonsil. 3. Otherwise unremarkable MRI appearance of the brain.   MRI orbits:   1. Flattened appearance of the posterior globes with vertical tortuosity of the optic nerves and optic nerve sheath diameter measuring at the upper limits of normal, bilaterally. These findings suggest papilledema and may be seen in the setting of idiopathic intracranial hypertension (pseudotumor cerebri). Correlate with ophthalmoscopic findings. 2. Otherwise unremarkable MRI appearance of the orbits. 3. Minor paranasal sinus mucosal thickening.   IMPRESSION: 1. Narrowed/stenotic appearance of the distal transverse dural venous sinuses bilaterally. This finding can be seen in the setting of idiopathic intracranial hypertension (pseudotumor cerebri). 2. No evidence of dural venous sinus thrombosis.     In addition, I have  personally and independently reviewed images through the PACS system.  Of note, she had sleep testing about 2 years ago through her dentist and it was a home sleep test, she reports it was negative.  In the electronic chart, she has a visit with sleep medicine in [place], [place]  on 08/07/2022 with Dr. Zelda Hickman, full note was not available.  Diagnoses included for that visit were snoring,  obesity, bruxism, insomnia, allergies, depression, anxiety.  Of note, her blood pressure is rather elevated today, upon recheck it was 177/120, upon triaging it was 176/104.  Her Past Medical History Is Significant For: Past Medical History:  Diagnosis Date   Acid reflux    Asthma    Migraines     Her Past Surgical History Is Significant For: Past Surgical History:  Procedure Laterality Date   CHOLECYSTECTOMY N/A 06/13/2021   Procedure: LAPAROSCOPIC CHOLECYSTECTOMY WITH INTRAOPERATIVE CHOLANGIOGRAM;  Surgeon: Oza Blumenthal, MD;  Location: [facility] OR;  Service: General;  Laterality: N/A;   ENDOSCOPIC RETROGRADE CHOLANGIOPANCREATOGRAPHY (ERCP) WITH PROPOFOL  N/A 06/14/2021   Procedure: ENDOSCOPIC RETROGRADE CHOLANGIOPANCREATOGRAPHY (ERCP) WITH PROPOFOL ;  Surgeon: Kenney Peacemaker, MD;  Location: [facility] ENDOSCOPY;  Service: Endoscopy;  Laterality: N/A;   REMOVAL OF STONES  06/14/2021   Procedure: REMOVAL OF STONES;  Surgeon: Kenney Peacemaker, MD;  Location: [facility] ENDOSCOPY;  Service: Endoscopy;;   SPHINCTEROTOMY  06/14/2021   Procedure: [address];  Surgeon: Kenney Peacemaker, MD;  Location[facility] ENDOSCOPY;  Service: Endoscopy;;   TONSILLECTOMY      Her Family History Is Significant For: Family History  Problem Relation Age of Onset   Diabetes Mother    Cancer Other    Stroke Other     Her Social History Is Significant For: Social History   Socioeconomic History   Marital status: Married    Spouse name: Not on file   Number of children: Not on file   Years of education: Not on file   Highest education level: Not on file  Occupational History   Not on file  Tobacco Use   Smoking status: Some Days    Types: Cigars   Smokeless tobacco: Never  Substance and Sexual Activity   Alcohol use: Yes    Comment: occ   Drug use: No   Sexual activity: Not on file  Other Topics Concern   Not on file  Social History Narrative   Single   ICU RN   Social Drivers of Health   Financial  Resource Strain: Not on file  Food Insecurity: Not on file  Transportation Needs: Not on file  Physical Activity: Not on file  Stress: Not on file  Social Connections: Not on file    Her Allergies Are:  Allergies  Allergen Reactions   Tilactase Nausea And Vomiting    CONSTIPATION  :   Her Current Medications Are:  Outpatient Encounter Medications as of 01/27/2024  Medication Sig   albuterol  (VENTOLIN  HFA) 108 (90 Base) MCG/ACT inhaler Inhale 2 puffs into the lungs every 6 (six) hours as needed for wheezing or shortness of breath.   lamoTRIgine 50 MG TBDP Take 1 tablet by mouth daily.   lurasidone (LATUDA) 40 MG TABS tablet EXTERNAL RX   omeprazole (PRILOSEC) 20 MG capsule 1 capsule 30 minutes before morning meal Orally Once a day for 90 days   traZODone (DESYREL) 100 MG tablet EXTERNAL RX   valsartan (DIOVAN) 40 MG tablet Take 40 mg by mouth daily.   acetaminophen  (TYLENOL ) 500 MG tablet Take 1,000 mg  by mouth every 6 (six) hours as needed for moderate pain.   ibuprofen (ADVIL) 200 MG tablet Take 800 mg by mouth every 6 (six) hours as needed for moderate pain.   oxyCODONE  (OXY IR/ROXICODONE ) 5 MG immediate release tablet Take 1 tablet (5 mg total) by mouth every 6 (six) hours as needed for severe pain.   No facility-administered encounter medications on file as of 01/27/2024.  :   Review of Systems:  Out of a complete 14 point review of systems, all are reviewed and negative with the exception of these symptoms as listed below:  Review of Systems  Neurological:        Room 5 Pt is here with her Wife. Pt states that she has a headache at least half of the week. Pt states that her headaches last a day that's if she doesn't take anything. Pt states that she takes Imitrex, Zanaflex, Excedrin, and Ibuprofen. Pt states that she sometimes has nausea with her headaches. Pt denies having blurry vision and double vision with her headaches. Pt states that she sometimes has sound sensitivity  with her headaches. Pt's wife states that pt will sometimes get lightheaded. Pt states that she would like to know does her diagnosis cause her blood pressure to go up. ESS 13 FSS 21    Objective:  Neurological Exam  Physical Exam Physical Examination:   Vitals:   01/27/24 0827  BP: (!) 176/104  Pulse: 65    General Examination: The patient is a very pleasant 28 y.o. female in no acute distress. She appears well-developed and well-nourished and well groomed.   HEENT: Normocephalic, atraumatic, pupils are equal, round and reactive to light, extraocular tracking is good without limitation to gaze excursion or nystagmus noted.  No further phobia, contact lenses in place.  Funduscopic exam does show hazy disc margins bilaterally.   Hearing is grossly intact. Face is symmetric with normal facial animation. Speech is clear with no dysarthria noted. There is no hypophonia. There is no lip, neck/head, jaw or voice tremor. Neck is supple with full range of passive and active motion. There are no carotid bruits on auscultation. Oropharynx exam reveals: No significant mouth dryness, good dental hygiene, no significant airway crowding, Mallampati class I, tonsils absent, small uvula noted.  Tongue protrudes centrally and palate elevates symmetrically, neck circumference 15 3/8 inches.    Chest: Clear to auscultation without wheezing, rhonchi or crackles noted.  Heart: S1+S2+0, regular and normal without murmurs, rubs or gallops noted.   Abdomen: Soft, non-tender and non-distended.  Extremities: There is no pitting edema in the distal lower extremities bilaterally.   Skin: Warm and dry without trophic changes noted.   Musculoskeletal: exam reveals no obvious joint deformities.   Neurologically:  Mental status: The patient is awake, alert and oriented in all 4 spheres. Her immediate and remote memory, attention, language skills and fund of knowledge are appropriate. There is no evidence of  aphasia, agnosia, apraxia or anomia. Speech is clear with normal prosody and enunciation. Thought process is linear. Mood is normal and affect is normal.  Cranial nerves II - XII are as described above under HEENT exam.  Motor exam: Normal bulk, strength and tone is noted. There is no obvious action or resting tremor.  No drift or rebound, no postural or intention tremor. Fine motor skills and coordination: intact with finger taps, hand movements and rapid alternating patting bilaterally in the upper extremities, normal foot taps bilaterally in the lower extremities.  Reflexes 1+  throughout, toes are downgoing bilaterally.  Romberg negative.  Cerebellar testing: No dysmetria or intention tremor. There is no truncal or gait ataxia.  Sensory exam: intact to light touch, temperature and vibration in the upper and lower extremities.  Gait, station and balance: She stands with difficulty. No veering to one side is noted. No leaning to one side is noted. Posture is age-appropriate and stance is narrow based. Gait shows normal stride length and normal pace. No problems turning are noted.  Normal tandem walk.  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  She had recent brain imaging test including orbital MRI with and without contrast, brain MRI with and without contrast and MR venogram of the head.  Her blood pressure was repeatedly elevated highly today.  She was advised to proceed to the emergency room for immediate management.  Lumbar puncture should only be done when blood pressure comes down.  She is also advised to get in touch with her PCP and cardiologist on this.  She may need adjustment of her blood pressure medication.  She is counseled on caffeine  reduction.  She is counseled on weight management.  We talked about the diagnosis of IIH, prognosis and treatment options.  She is advised to proceed with a lumbar puncture and a sleep study.  She is advised to keep a close check on her eye related symptoms.   Below is a summary of my recommendations and our discussion points today.  They were given these instructions and recommendations verbally during the visit and also in her MyChart after visit summary which she can access electronically as she indicated:   << 1.  If you have sudden changes in your vision especially loss of vision, please proceed to the emergency room immediately.  If you have gradual changes in your vision especially decrease in your visual field, make an appointment with your ophthalmologist as soon as possible.  I would recommend a routine follow-up with Dr. Mason Sole.  2.  Your blood pressure is very elevated today, I recommend you schedule a follow-up appointment with your primary care provider soon as possible for blood pressure management. 3. We will request a LP (lumbar puncture/spinal tap) with pressure testing and routine fluid testing. We will call you with the results. If your spinal fluid pressure is indeed elevated, we will have you start a medication called diamox to help keep your spinal fluid pressure at bay.  Some people need more than 1 spinal tap over time. 4. As you know, your vision and visual field can be affected. The most serious complication of having pseudotumor cerebri is loss of vision, which can be permanent. Work up, at least initially with the eye specialist should include: best corrected visual acuity, formal visual field testing, dilated fundus examination with optic disc photographs, and often optical coherence tomography (OCT) of the optic nerve, retinal nerve fiber layer, and macular ganglion cell layer. Worsening vision is an indication for intensifying treatment.  5.  Ultimately, you may need to be  considered for a shunt in the future (to prevent fluid pressure from building up over and over). 6. We will also do a sleep study to rule out obstructive sleep apnea.  If you have obstructive sleep apnea, I will want you  to start treatment with a machine called CPAP or AutoPAP.  Treatment of obstructive sleep apnea can help headaches, also help with metabolism and weight loss, and ultimately, treatment of sleep apnea can reduce risk for cardiovascular complications including heart disease and stroke risk. 7.  Please work on weight loss as losing weight may help your blood pressure, your headaches, improved sleep apnea if you have sleep apnea and also improve your condition called IIH. 8.  Due to highly elevated blood pressure which we checked twice today, please proceed to the emergency room for more immediate management.  We can only safely do a lumbar puncture once your blood pressure comes down a little bit. >> We will plan a follow-up in about 3 months, sooner if needed.  We will keep her posted as to her test results by phone call in the interim. This was an extended visit of over 60 minutes of high complexity with copious record review involved and considerable counseling and coordination of care, addressing multiple issues including obstructive sleep apnea and treatment options.    Thank you very much for allowing me to participate in the care of this nice patient. If I can be of any further assistance to you please do not hesitate to call me at [phone].  Sincerely,   Frances Fairy, MD, PhD

## 2024-01-27 NOTE — ED Notes (Signed)
 Pt wondering about leaving, does not want 2nd trop. Pt stated she will follow up with cards for her bp management. MD made aware.

## 2024-01-27 NOTE — Patient Instructions (Addendum)
 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 I would like to suggest a few things today:   1.  If you have sudden changes in your vision especially loss of vision, please proceed to the emergency room immediately.  If you have gradual changes in your vision especially decrease in your visual field, make an appointment with your ophthalmologist as soon as possible.  I would recommend a routine follow-up with Dr. Mason Sole.  2.  Your blood pressure is very elevated today, I recommend you schedule a follow-up appointment with your primary care provider soon as possible for blood pressure management. 3. We will request a LP (lumbar puncture/spinal tap) with pressure testing and routine fluid testing. We will call you with the results. If your spinal fluid pressure is indeed elevated, we will have you start a medication called diamox to help keep your spinal fluid pressure at bay.  Some people need more than 1 spinal tap over time. 4. As you know, your vision and visual field can be affected. The most serious complication of having pseudotumor cerebri is loss of vision, which can be permanent. Work up, at least initially with the eye specialist should include: best corrected visual acuity, formal visual field testing, dilated fundus examination with optic disc photographs, and often optical coherence tomography (OCT) of the optic nerve, retinal nerve fiber layer, and macular ganglion cell layer. Worsening vision is an indication for intensifying treatment.  5.  Ultimately, you may need to be considered for a shunt in the future (to prevent fluid pressure from building up over and over). 6. We will also do a sleep study to rule out  obstructive sleep apnea.  If you have obstructive sleep apnea, I will want you to start treatment with a machine called CPAP or AutoPAP.  Treatment of obstructive sleep apnea can help headaches, also help with metabolism and weight loss, and ultimately, treatment of sleep apnea can reduce risk for cardiovascular complications including heart disease and stroke risk. 7.  Please work on weight loss as losing weight may help your blood pressure, your headaches, improved sleep apnea if you have sleep apnea and also improve your condition called IIH. 8.  Due to highly elevated blood pressure which we checked twice today, please proceed to the emergency room for more immediate management.  We can only safely do a lumbar puncture once your blood pressure comes down a little bit.

## 2024-01-27 NOTE — ED Triage Notes (Signed)
 Pt at neurologist appt today and hypertensive. Pt reports HTN for past 4 months, started on losartan last month. Reports intermittent symptoms with the HTN. Today, pt had some chest pressure and nausea. Pt seeing cards for HTN.

## 2024-01-30 NOTE — Discharge Instructions (Signed)

## 2024-02-01 DIAGNOSIS — Z6841 Body Mass Index (BMI) 40.0 and over, adult: Secondary | ICD-10-CM | POA: Insufficient documentation

## 2024-02-01 DIAGNOSIS — I160 Hypertensive urgency: Secondary | ICD-10-CM | POA: Insufficient documentation

## 2024-02-02 ENCOUNTER — Telehealth: Payer: Self-pay | Admitting: Neurology

## 2024-02-02 ENCOUNTER — Telehealth: Payer: Self-pay

## 2024-02-02 NOTE — Telephone Encounter (Signed)
 Spoke with pt about upcoming LP. Pt's neurologist made a note stating the pt LP cannot safely be performed until her BP is down. Pt was in hospital yesterday due to hypertension. Pt verbalizes understanding and wants to proceed with LP if her BP is WNL tomorrow.

## 2024-02-02 NOTE — Telephone Encounter (Signed)
 [employer] no auth req vai auto machine ref # [id number]   Sent mychart.

## 2024-02-03 ENCOUNTER — Inpatient Hospital Stay
Admission: RE | Admit: 2024-02-03 | Discharge: 2024-02-03 | Disposition: A | Discharge status: Home / Self Care | Source: Ambulatory Visit | Attending: Neurology | Admitting: Neurology

## 2024-02-11 NOTE — Discharge Instructions (Signed)

## 2024-02-12 ENCOUNTER — Ambulatory Visit
Admission: RE | Admit: 2024-02-12 | Discharge: 2024-02-12 | Disposition: A | Discharge status: Home / Self Care | Source: Ambulatory Visit | Attending: Neurology | Admitting: Neurology

## 2024-02-12 ENCOUNTER — Ambulatory Visit: Payer: Self-pay | Admitting: Neurology

## 2024-02-12 VITALS — BP 151/92 | HR 59

## 2024-02-12 DIAGNOSIS — H4710 Unspecified papilledema: Secondary | ICD-10-CM

## 2024-02-12 DIAGNOSIS — G932 Benign intracranial hypertension: Secondary | ICD-10-CM

## 2024-02-12 DIAGNOSIS — R0683 Snoring: Secondary | ICD-10-CM

## 2024-02-12 DIAGNOSIS — Z820 Family history of epilepsy and other diseases of the nervous system: Secondary | ICD-10-CM

## 2024-02-12 DIAGNOSIS — Z6841 Body Mass Index (BMI) 40.0 and over, adult: Secondary | ICD-10-CM

## 2024-02-12 DIAGNOSIS — Z8669 Personal history of other diseases of the nervous system and sense organs: Secondary | ICD-10-CM

## 2024-02-12 DIAGNOSIS — R519 Headache, unspecified: Secondary | ICD-10-CM

## 2024-02-12 DIAGNOSIS — Z9189 Other specified personal risk factors, not elsewhere classified: Secondary | ICD-10-CM

## 2024-02-12 DIAGNOSIS — R030 Elevated blood-pressure reading, without diagnosis of hypertension: Secondary | ICD-10-CM

## 2024-02-12 MED ORDER — ACETAZOLAMIDE ER 500 MG PO CP12: 500.0000 mg | ORAL_CAPSULE | Freq: Two times a day (BID) | ORAL | 3 refills | Status: DC

## 2024-02-12 NOTE — Telephone Encounter (Signed)
-----   Message from [facility] sent at 02/12/2024  4:31 PM EDT ----- Pls call pt: LP showed elevated opening pressure, as suspected, confirming Dx of IIH. As discussed: we will start her on Diamox (generic name: acetazolamide) 500 mg strength: Take one pill each night at bedtime for 1 week, then 1 pill twice daily thereafter. Common side effects reported are: Dizziness, lightheadedness, increase in urine output, blurry vision, dry mouth, drowsiness, loss of appetite, upset stomach, headache and tiredness, tingling in the hands and feet and change in taste, especially with carbonated sodas. Most side effects are transient especially during the first few days as the body adjusts to the medication.  Rx sent to pharmacy on file.

## 2024-02-12 NOTE — Telephone Encounter (Signed)
 Spoke to patient gave LP results Per DR Omar Bibber LP showed elevated opening pressure, as suspected, confirming Dx of IIH. As discussed: we will start her on Diamox (generic name: acetazolamide) 500 mg strength: Take one pill each night at bedtime for 1 week, then 1 pill twice daily thereafter. Common side effects reported are: Dizziness, lightheadedness, increase in urine output, blurry vision, dry mouth, drowsiness, loss of appetite, upset stomach, headache and tiredness, tingling in the hands and feet and change in taste, especially with carbonated sodas. Most side effects are transient especially during the first few days as the body adjusts to the medication. Rx sent to pharmacy on file. Pt expressed understanding and thanked me for calling

## 2024-02-29 LAB — CSF CELL COUNT WITH DIFFERENTIAL
RBC Count, CSF: 0 {cells}/uL
TOTAL NUCLEATED CELL: 1 {cells}/uL (ref 0–5)

## 2024-02-29 LAB — PROTEIN, CSF: Total Protein, CSF: 28 mg/dL (ref 15–45)

## 2024-02-29 LAB — CSF CULTURE W GRAM STAIN
MICRO NUMBER:: 16489525
Result:: NO GROWTH
SPECIMEN QUALITY:: ADEQUATE

## 2024-02-29 LAB — GLUCOSE, CSF: Glucose, CSF: 65 mg/dL (ref 40–80)

## 2024-03-09 ENCOUNTER — Ambulatory Visit (INDEPENDENT_AMBULATORY_CARE_PROVIDER_SITE_OTHER): Admitting: Neurology

## 2024-03-09 DIAGNOSIS — Z820 Family history of epilepsy and other diseases of the nervous system: Secondary | ICD-10-CM

## 2024-03-09 DIAGNOSIS — R030 Elevated blood-pressure reading, without diagnosis of hypertension: Secondary | ICD-10-CM

## 2024-03-09 DIAGNOSIS — Z9189 Other specified personal risk factors, not elsewhere classified: Secondary | ICD-10-CM

## 2024-03-09 DIAGNOSIS — R519 Headache, unspecified: Secondary | ICD-10-CM

## 2024-03-09 DIAGNOSIS — Z8669 Personal history of other diseases of the nervous system and sense organs: Secondary | ICD-10-CM

## 2024-03-09 DIAGNOSIS — R0683 Snoring: Secondary | ICD-10-CM

## 2024-03-09 DIAGNOSIS — H4710 Unspecified papilledema: Secondary | ICD-10-CM

## 2024-03-09 DIAGNOSIS — G932 Benign intracranial hypertension: Secondary | ICD-10-CM

## 2024-03-18 NOTE — Procedures (Signed)
 Physician Interpretation:     [facility] POLYSOMNOGRAPHY  INTERPRETATION REPORT   STUDY DATE:  03/09/2024     PATIENT NAME:  Frances Jones         DATE OF BIRTH:  1995-12-18  PATIENT ID:  [id number]    TYPE OF STUDY:  PSG  READING PHYSICIAN: TRUE MAR, MD, PhD   SCORING TECHNICIAN: Jesusa Haddock, RPSGT   Referred by: Dr. Lonni Fairly ? History and Indication for Testing: 28 year old female with an underlying medical history of asthma, migraines, acid reflux disease, papilledema, IIH, and morbid obesity with a BMI of over 40, who reports recurrent headaches. She has a history of snoring. Her Epworth sleepiness score is 13 out of 24, fatigue severity score is 21 out of 63.  Height: 67 in Weight: 301 lb (BMI 47) Neck Size: 16 in    MEDICATIONS: Ventolin , Latuda, Lamotrigine, Prilosec, Desyrel, Diovan, Tylenol , Advil, Oxycodone    TECHNICAL DESCRIPTION: A registered sleep technologist was in attendance for the duration of the recording.  Data collection, scoring, video monitoring, and reporting were performed in compliance with the AASM Manual for the Scoring of Sleep and Associated Events; (Hypopnea is scored based on the criteria listed in Section VIII D. 1b in the AASM Manual V2.6 using a 4% oxygen desaturation rule or Hypopnea is scored based on the criteria listed in Section VIII D. 1a in the AASM Manual V2.6 using 3% oxygen desaturation and /or arousal rule).   SLEEP CONTINUITY AND SLEEP ARCHITECTURE:  Lights-out was at 21:49: and lights-on at  05:05:, with a total recording time of 7 hours, 16.5 min. Total sleep time ( TST) was 392.5 minutes with a normal sleep efficiency at 89.9%. There was  19.2% REM sleep.  BODY POSITION:  TST was divided  between the following sleep positions: 47.0% supine;  53.0% lateral;  0% prone. Duration of total sleep and percent of total sleep in their respective position is as follows: supine 184 minutes (47%), non-supine  208 minutes (53%); right 116 minutes (30%), left 92 minutes (23%), and prone 00 minutes (0%).  Total supine REM sleep time was 34 minutes (46% of total REM sleep).  Sleep latency was normal at 19.5 minutes.  REM sleep latency was high normal at 111.0 minutes. Of the total sleep time, the percentage of stage N1 sleep was 2.8%, stage N2 sleep was 59%, which is mildly increased, stage N3 sleep was 19.2%, which is normal and REM sleep was 19.2%, which is near-normal. Wake after sleep onset (WASO) time accounted for 24.5 minutes with minimal sleep fragmentation noted.    RESPIRATORY MONITORING:   Based on CMS criteria (using a 4% oxygen desaturation rule for scoring hypopneas), there were 1 apneas (1 obstructive; 0 central; 0 mixed), and 8 hypopneas.  Apnea index was 0.2. Hypopnea index was 1.2. The apnea-hypopnea index was 1.4 overall (2.0 supine, 3 non-supine; 4.8 REM, 7.0 supine REM).  There were 0 respiratory effort-related arousals (RERAs).  The RERA index was 0 events/h. Total respiratory disturbance index (RDI) was 1.4 events/h. RDI results showed: supine RDI  2.0 /h; non-supine RDI 0.9 /h; REM RDI 4.8 /h, supine REM RDI 7.0 /h.   Based on AASM criteria (using a 3% oxygen desaturation and /or arousal rule for scoring hypopneas), there were 1 apneas (1 obstructive; 0 central; 0 mixed), and 18 hypopneas. Apnea index was 0.2. Hypopnea index was 2.8. The apnea-hypopnea index was 2.9;/hour overall (4.9 supine, 3 non-supine; 7.9 REM, 13.9 supine REM).  There were 0 respiratory effort-related arousals (RERAs).  The RERA index was 0 events/h. Total respiratory disturbance index (RDI) was 2.9 events/h. RDI results showed: supine RDI  4.9 /h; non-supine RDI 1.2 /h; REM RDI 7.9 /h, supine REM RDI 13.9 /h.    OXIMETRY: Oxyhemoglobin Saturation Nadir during sleep was at  88% from a mean of 97%.  Of the Total sleep time (TST)   hypoxemia (=<88%) was present for  0.5 minutes, or 0.1% of total sleep time.    LIMB  MOVEMENTS: There were 0 periodic limb movements of sleep (0.0/hr), of which 0 (0.0/hr) were associated with an arousal.   AROUSAL: There were 31 arousals in total, for an arousal index of 5 arousals/hour.  Of these, 2 were identified as respiratory-related arousals (0 /h), 0 were PLM-related arousals (0 /h), and 40 were non-specific arousals (6 /h).    EEG: Review of the EEG showed no abnormal electrical discharges and symmetrical bihemispheric findings.      EKG: The EKG revealed normal sinus rhythm (NSR). The average heart rate during sleep was 67 bpm.    AUDIO/VIDEO REVIEW: The audio and video review did not show any abnormal or unusual behaviors, movements, phonations or vocalizations. The patient took no restroom breaks. Snoring was noted, intermittently in the mild to moderate range.    POST-STUDY QUESTIONNAIRE: Post study, the patient indicated, that sleep was the same as usual.     IMPRESSION:    1. Primary Snoring   RECOMMENDATIONS:    1. This study does not demonstrate any significant obstructive or central sleep disordered breathing with an AHI of less than 5/hour - her AHI was 2.9/h, O2 nadir 88% during supine REM sleep.  Mild to moderate intermittent snoring was detected. Treatment with a positive airway pressure device, such as CPAP or autoPAP is not indicated. Weight loss and avoidance of the supine sleep position may aid in reducing her snoring.  2. This study shows minimal sleep fragmentation and minimally abnormal sleep stage percentages; these are nonspecific findings and per se do not signify an intrinsic sleep disorder or a cause for the patient's sleep-related symptoms. Causes include (but are not limited to) the first night effect of the sleep study, circadian rhythm disturbances, medication effect or an underlying mood disorder or medical problem.  3. The patient should be cautioned not to drive, work at heights, or operate dangerous or heavy equipment when tired or  sleepy. Review and reiteration of good sleep hygiene measures should be pursued with any patient. 4. The patient will be advised to follow up with the referring provider, who will be notified of the test results.   I certify that I have reviewed the entire raw data recording prior to the issuance of this report in accordance with the Standards of Accreditation of the American Academy of Sleep Medicine (AASM).  True Mar, MD, PhD Medical Director, [facility]) Diplomat, ABPN (Neurology and Sleep)               Technical Report:   General Information  Name: Frances Jones, Frances Jones BMI: 47.14 Physician: TRUE MAR, MD  ID: [id number] Height: 67.0 in Technician: Jesusa Haddock, RPSGT  Sex: Female Weight: 301.0 lb Record: [id number]  Age: 89 [Jan 21, 1996] Date: 03/09/2024    Medical & Medication History    22-year-old female with an underlying medical history of asthma, migraines, acid reflux disease, and morbid obesity with a BMI of over 40, who reports recurrent headaches for the past  few months. She also had noticed some intermittent visual blurriness, headache started about December 2024. 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 Of note, she had sleep testing about 2 years ago through her dentist and it was a home sleep test, she reports it was negative. Ventolin , Latuda, Lamotrigine, Prilosec, Desyrel, Diovan, Tylenol , Advil, Oxycodone    Sleep Disorder      Comments   The patient came into the sleep lab for a PSG. The patient took Trazodone prior to start of study. No restroom break. EKG did not show any obvious cardiac arrhythmias. Mild to moderate snoring. All sleep stages witnessed. Respiratory events scored with a 3% desat. Majority of respirator events were during REM. The patient slept supine and lateral. AHI was 4.7 after 2hrs of TST.     Lights out: 09:49:12 PM  Lights on: 05:05:35 AM   Time Total Supine Side Prone Upright  Recording (TRT) 7h 16.47m 3h 11.45m 4h 5.43m 0h 0.86m 0h 0.67m  Sleep (TST) 6h 32.49m 3h 4.37m 3h 28.68m 0h 0.74m 0h 0.25m   Latency N1 N2 N3 REM Onset Per. Slp. Eff.  Actual 0h 0.68m 0h 6.38m 0h 21.56m 1h 51.44m 0h 19.33m 0h 24.46m 89.92%   Stg Dur Wake N1 N2 N3 REM  Total 44.0 11.0 230.5 75.5 75.5  Supine 6.5 2.5 117.0 30.5 34.5  Side 37.5 8.5 113.5 45.0 41.0  Prone 0.0 0.0 0.0 0.0 0.0  Upright 0.0 0.0 0.0 0.0 0.0   Stg % Wake N1 N2 N3 REM  Total 10.1 2.8 58.7 19.2 19.2  Supine 1.5 0.6 29.8 7.8 8.8  Side 8.6 2.2 28.9 11.5 10.4  Prone 0.0 0.0 0.0 0.0 0.0  Upright 0.0 0.0 0.0 0.0 0.0     Apnea Summary Sub Supine Side Prone Upright  Total 1 Total 1 0 1 0 0    REM 0 0 0 0 0    NREM 1 0 1 0 0  Obs 1 REM 0 0 0 0 0    NREM 1 0 1 0 0  Mix 0 REM 0 0 0 0 0    NREM 0 0 0 0 0  Cen 0 REM 0 0 0 0 0    NREM 0 0 0 0 0   Rera Summary Sub Supine Side Prone Upright  Total 0 Total 0 0 0 0 0    REM 0 0 0 0 0    NREM 0 0 0 0 0   Hypopnea Summary Sub Supine Side Prone Upright  Total 18 Total 18 15 3  0 0    REM 10 8 2  0 0    NREM 8 7 1  0 0   4% Hypopnea Summary Sub Supine Side Prone Upright  Total (4%) 8 Total 8 6 2  0 0    REM 6 4 2  0 0    NREM 2 2 0 0 0     AHI Total Obs Mix Cen  2.90 Apnea 0.15 0.15 0.00 0.00   Hypopnea 2.75 -- -- --  1.38 Hypopnea (4%) 1.22 -- -- --    Total Supine Side Prone Upright  Position AHI 2.90 4.88 1.15 0.00 0.00  REM AHI 7.95   NREM AHI 1.70   Position RDI 2.90 4.88 1.15 0.00 0.00  REM RDI 7.95   NREM RDI 1.70    4% Hypopnea Total Supine Side Prone Upright  Position AHI (4%) 1.38 1.95 0.87  0.00 0.00  REM AHI (4%) 4.77   NREM AHI (4%) 0.57   Position RDI (4%) 1.38 1.95 0.87 0.00 0.00  REM RDI (4%) 4.77   NREM RDI (4%) 0.57    Desaturation Information Threshold: 2% <100% <90% <80% <70% <60% <50% <40%  Supine 44.0 1.0 0.0 0.0 0.0 0.0 0.0  Side 19.0 0.0 0.0 0.0 0.0 0.0 0.0  Prone 0.0 0.0 0.0  0.0 0.0 0.0 0.0  Upright 0.0 0.0 0.0 0.0 0.0 0.0 0.0  Total 63.0 1.0 0.0 0.0 0.0 0.0 0.0  Index 9.1 0.1 0.0 0.0 0.0 0.0 0.0   Threshold: 3% <100% <90% <80% <70% <60% <50% <40%  Supine 16.0 1.0 0.0 0.0 0.0 0.0 0.0  Side 2.0 0.0 0.0 0.0 0.0 0.0 0.0  Prone 0.0 0.0 0.0 0.0 0.0 0.0 0.0  Upright 0.0 0.0 0.0 0.0 0.0 0.0 0.0  Total 18.0 1.0 0.0 0.0 0.0 0.0 0.0  Index 2.6 0.1 0.0 0.0 0.0 0.0 0.0   Threshold: 4% <100% <90% <80% <70% <60% <50% <40%  Supine 6.0 1.0 0.0 0.0 0.0 0.0 0.0  Side 2.0 0.0 0.0 0.0 0.0 0.0 0.0  Prone 0.0 0.0 0.0 0.0 0.0 0.0 0.0  Upright 0.0 0.0 0.0 0.0 0.0 0.0 0.0  Total 8.0 1.0 0.0 0.0 0.0 0.0 0.0  Index 1.2 0.1 0.0 0.0 0.0 0.0 0.0   Threshold: 3% <100% <90% <80% <70% <60% <50% <40%  Supine 16 1 0 0 0 0 0  Side 2 0 0 0 0 0 0  Prone 0 0 0 0 0 0 0  Upright 0 0 0 0 0 0 0  Total 18 1 0 0 0 0 0   Awakening/Arousal Information # of Awakenings 15  Wake after sleep onset 24.37m  Wake after persistent sleep 22.31m   Arousal Assoc. Arousals Index  Apneas 0 0.0  Hypopneas 2 0.3  Leg Movements 0 0.0  Snore 0 0.0  PTT Arousals 0 0.0  Spontaneous 41 6.3  Total 43 6.6  Leg Movement Information PLMS LMs Index  Total LMs during PLMS 0 0.0  LMs w/ Microarousals 0 0.0   LM LMs Index  w/ Microarousal 0 0.0  w/ Awakening 0 0.0  w/ Resp Event 0 0.0  Spontaneous 0 0.0  Total 0 0.0     Desaturation threshold setting: 3% Minimum desaturation setting: 10 seconds SaO2 nadir: 88% The longest event was a 65 sec obstructive Hypopnea with a minimum SaO2 of 94%. The lowest SaO2 was 88% associated with a 43 sec obstructive Hypopnea. EKG Rates EKG Avg Max Min  Awake 69 103 51  Asleep 67 101 50  EKG Events: Tachycardia

## 2024-03-22 NOTE — Telephone Encounter (Signed)
 Noted

## 2024-03-22 NOTE — Telephone Encounter (Signed)
-----   Message from True Mar sent at 03/19/2024 11:06 AM EDT ----- See MyChart message to patient, FYI.  ----- Message ----- From: Mar True, MD Sent: 03/19/2024  11:00 AM EDT To: True Mar, MD

## 2024-04-29 ENCOUNTER — Encounter: Payer: Self-pay | Admitting: Neurology

## 2024-04-29 ENCOUNTER — Ambulatory Visit: Admitting: Neurology

## 2024-04-29 VITALS — BP 130/88 | HR 80 | Ht 67.0 in | Wt 298.0 lb

## 2024-04-29 DIAGNOSIS — G932 Benign intracranial hypertension: Secondary | ICD-10-CM

## 2024-04-29 DIAGNOSIS — H4710 Unspecified papilledema: Secondary | ICD-10-CM | POA: Diagnosis not present

## 2024-04-29 NOTE — Patient Instructions (Signed)
  1.  If you have sudden changes in your vision especially loss of vision, please proceed to the emergency room immediately.  If you have gradual changes in your vision especially decrease in your visual field, make an appointment with your ophthalmologist as soon as possible.  Please keep all your routine follow-up appointments with Dr. Maree.  2.  Continue to work on weight loss  3.  As you know, your vision and visual field can be affected. The most serious complication of having pseudotumor cerebri is loss of vision, which can be permanent. Work up, at least initially with the eye specialist should include: best corrected visual acuity, formal visual field testing, dilated fundus examination with optic disc photographs, and often optical coherence tomography (OCT) of the optic nerve, retinal nerve fiber layer, and macular ganglion cell layer. Worsening vision is an indication for intensifying treatment.  4.  Your sleep study showed benign findings.   5.  Continue with Diamox  500 mg twice daily.   6. Follow-up to see one of our nurse practitioners in about 6 to 8 months, sooner if needed.

## 2024-04-29 NOTE — Progress Notes (Signed)
 Subjective:    Patient ID: Frances Jones is a 28 y.o. female.  HPI    Interim history:   Ms. 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  The patient is accompanied by her wife today.  I first met her at the request of her ophthalmologist on 01/27/2024, at which time she reported blurry vision and recurrent headaches.  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  Her blood pressure was highly elevated during the visit and she was advised to follow-up with PCP regarding blood pressure management.  We talked about IIH and initiated further workup.  She had a spinal tap on 02/12/2024 which showed an opening pressure of 34 cm.  She was advised to start Diamox . She had a baseline sleep study through our sleep lab on 03/09/2024 which showed an AHI of 2.9/h, O2 nadir 88% with intermittent mild to moderate snoring.  Today, 04/29/2024: She reports feeling a little better.  She has less headaches.  She had an appointment with Dr. Maree for a checkup of her eyes about 3 weeks ago, I did not receive the note yet.  She is willing to asked them to send the note to us .  She reports that her eye nerve swelling was better and that he wanted to see her back in about 3 months.  He encouraged her to continue with Diamox  as well.  She tolerates the medication.  She has no new concerns.  We talked about her spinal tap results as well as her sleep test results.  She does not drink caffeine daily.  If anything, she will have 1 caffeine containing beverage during work, she works 3 nights a week.  The patient's allergies, current medications, family history, past medical history, past social history, past surgical history and problem list were reviewed and updated as appropriate.    Previously:    01/27/2024: (She) reports recurrent headaches for the past few months.   She also had noticed some intermittent visual blurriness, headache started about December 2024.  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   She denies any sudden onset of one-sided weakness or numbness or tingling or droopy face or slurring of speech.  She uses contact lenses.  She has gained weight over time, but more stable in the last 6 months.  She works as a travel Engineer, civil (consulting), typically in the ICU.  She lives with her wife and wife's 42 year old son. She drinks quite a bit of caffeine in the form of tea and soda, altogether about 3-4 or 5 servings per day on average.  She also drinks an energy drink 3 times a week.  She works nights, 3 12-hour shifts.  She follows with cardiology and had a recent CT scan.  She has had elevated blood pressure values, recently was started on valsartan about 4 to 5 weeks ago per cardiology.  She sees Dr. Steffi for cardiology and sees a PA for psychiatry, is on Lamictal, Latuda, and trazodone at night.  Her primary care is Dr. Arlee.   Cardiology records from April 2025 were reviewed.  She had a CT coronary and cardiac morphology test through [facility] on 01/16/2024 and I reviewed the results: IMPRESSION:  1. Noncardiac findings as above.  2. Please see separate dictation by  cardiology regarding cardiac and coronary artery findings.    I reviewed your office note from 10/13/2023.  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  Her optometrist is Dr. Garrel Satchel.    In addition, she had a recent follow-up on 01/18/2024, those records are not available for my review today but patient and her wife state that her findings were stable or her papilledema even slightly improved.   She had a brain MRI with and without contrast, MRI of the orbits with and without contrast and MR venogram of the head without contrast on 11/20/2023 and I reviewed the results:      IMPRESSION: MRI brain:   1.   No evidence of an acute intracranial abnormality. 2. Mildly low-lying left cerebellar tonsil. 3. Otherwise unremarkable MRI appearance of the brain.   MRI orbits:   1. Flattened appearance of the posterior globes with vertical tortuosity of the optic nerves and optic nerve sheath diameter measuring at the upper limits of normal, bilaterally. These findings suggest papilledema and may be seen in the setting of idiopathic intracranial hypertension (pseudotumor cerebri). Correlate with ophthalmoscopic findings. 2. Otherwise unremarkable MRI appearance of the orbits. 3. Minor paranasal sinus mucosal thickening.     IMPRESSION: 1. Narrowed/stenotic appearance of the distal transverse dural venous sinuses bilaterally. This finding can be seen in the setting of idiopathic intracranial hypertension (pseudotumor cerebri). 2. No evidence of dural venous sinus thrombosis.     In addition, I have personally and independently reviewed images through the PACS system.   Of note, she had sleep testing about 2 years ago through her dentist and it was a home sleep test, she reports it was negative.  In the electronic chart, she has a visit with sleep medicine in [place], [place]  on 08/07/2022 with Dr. Dennise, full note was not available.  Diagnoses included for that visit were snoring, obesity, bruxism, insomnia, allergies, depression, anxiety.   Of note, her blood pressure is rather elevated today, upon recheck it was 177/120, upon triaging it was 176/104.   Her Past Medical History Is Significant For: Past Medical History:  Diagnosis Date   Acid reflux    Asthma    Hidradenitis suppurativa    Hypertension    Migraines     Her Past Surgical History Is Significant For: Past Surgical History:  Procedure Laterality Date   CHOLECYSTECTOMY N/A 06/13/2021   Procedure: LAPAROSCOPIC CHOLECYSTECTOMY WITH INTRAOPERATIVE CHOLANGIOGRAM;  Surgeon: Vernetta Berg, MD;  Location: [facility] OR;  Service:  General;  Laterality: N/A;   cyst removal under L arm     ENDOSCOPIC RETROGRADE CHOLANGIOPANCREATOGRAPHY (ERCP) WITH PROPOFOL  N/A 06/14/2021   Procedure: ENDOSCOPIC RETROGRADE CHOLANGIOPANCREATOGRAPHY (ERCP) WITH PROPOFOL ;  Surgeon: Avram Lupita BRAVO, MD;  Location: [facility] ENDOSCOPY;  Service: Endoscopy;  Laterality: N/A;   REMOVAL OF STONES  06/14/2021   Procedure: REMOVAL OF STONES;  Surgeon: Avram Lupita BRAVO, MD;  Location: [facility] ENDOSCOPY;  Service: Endoscopy;;   SPHINCTEROTOMY  06/14/2021   Procedure: ANNETT;  Surgeon: Avram Lupita BRAVO, MD;  Location: [facility] ENDOSCOPY;  Service: Endoscopy;;   TONSILLECTOMY      Her Family History Is Significant For: Family History  Problem Relation Age of Onset   Diabetes Mother    Cancer Other    Stroke Other     Her Social History Is Significant For: Social History   Socioeconomic History   Marital status: Married    Spouse name: Not on file   Number of children: Not on file  Years of education: Not on file   Highest education level: Not on file  Occupational History   Not on file  Tobacco Use   Smoking status: Some Days    Types: Cigars   Smokeless tobacco: Never  Vaping Use   Vaping status: Never Used  Substance and Sexual Activity   Alcohol use: Yes    Comment: occ   Drug use: No   Sexual activity: Not on file  Other Topics Concern   Not on file  Social History Narrative   Single   ICU RN      Update 04/29/2024   Caffeine: 3 per week   Social Drivers of Health   Financial Resource Strain: Low Risk  (03/23/2024)   Received from [facility]   Overall Financial Resource Strain (CARDIA)    How hard is it for you to pay for the very basics like food, housing, medical care, and heating?: Not hard at all  Food Insecurity: No Food Insecurity (03/23/2024)   Received from [facility]   Hunger Vital Sign    Within the past 12 months, you worried that your food would run out before you got the money to buy more.: Never true     Within the past 12 months, the food you bought just didn't last and you didn't have money to get more.: Never true  Transportation Needs: No Transportation Needs (03/23/2024)   Received from [facility]   PRAPARE - Transportation    Lack of Transportation (Medical): No    Lack of Transportation (Non-Medical): No  Physical Activity: Not on file  Stress: Not on file  Social Connections: Not on file    Her Allergies Are:  Allergies  Allergen Reactions   Tilactase Nausea And Vomiting    CONSTIPATION  :   Her Current Medications Are:  Outpatient Encounter Medications as of 04/29/2024  Medication Sig   acetaminophen  (TYLENOL ) 500 MG tablet Take 500-1,000 mg by mouth every 6 (six) hours as needed for moderate pain (pain score 4-6).   acetaZOLAMIDE  ER (DIAMOX ) 500 MG capsule Take 1 capsule (500 mg total) by mouth 2 (two) times daily.   albuterol  (VENTOLIN  HFA) 108 (90 Base) MCG/ACT inhaler Inhale 2 puffs into the lungs every 6 (six) hours as needed for wheezing or shortness of breath.   clindamycin (CLEOCIN T) 1 % external solution Apply 1 Application topically daily.   ibuprofen (ADVIL) 200 MG tablet Take 200-800 mg by mouth every 6 (six) hours as needed for moderate pain (pain score 4-6).   omeprazole (PRILOSEC) 20 MG capsule Take 20 mg by mouth daily as needed (Indigestion).   traZODone (DESYREL) 50 MG tablet Take 100 mg by mouth at bedtime.   [DISCONTINUED] valsartan (DIOVAN) 40 MG tablet Take 40 mg by mouth daily.   amLODipine (NORVASC) 5 MG tablet Take 5 mg by mouth daily.   lamoTRIgine 50 MG TBDP Take 50 mg by mouth at bedtime. (Patient not taking: Reported on 04/29/2024)   lurasidone (LATUDA) 40 MG TABS tablet Take 40 mg by mouth at bedtime. (Patient not taking: Reported on 04/29/2024)   valsartan (DIOVAN) 160 MG tablet Take 160 mg by mouth daily.   No facility-administered encounter medications on file as of 04/29/2024.  :  Review of Systems:  Out of a complete 14 point review of  systems, all are reviewed and negative with the exception of these symptoms as listed below:  Review of Systems  Neurological:  Patient is here with spouse for follow-up. She is on Diamox  and isn't sure it always does what it is supposed to. She said sometimes she will go hours without having to urinate and then sometimes she has shortness of breath. She works 12 hour night shifts and before starting the Diamox  she would use the bathroom about 3 times a night. She denies any changes in her symptoms otherwise.     Objective:  Neurological Exam  Physical Exam Physical Examination:   Vitals:   04/29/24 0954  BP: 130/88  Pulse: 80    General Examination: The patient is a very pleasant 28 y.o. female in no acute distress. She appears well-developed and well-nourished and well groomed.   HEENT: Normocephalic, atraumatic, pupils are equal, round and reactive to light, extraocular tracking is good without limitation to gaze excursion or nystagmus noted.  No photophobia, contact lenses in place.  Funduscopic exam shows quite benign findings, not sure that she has any papilledema at this time.  Hearing is grossly intact. Face is symmetric with normal facial animation. Speech is clear with no dysarthria noted. There is no hypophonia. There is no lip, neck/head, jaw or voice tremor. Neck is supple with full range of passive and active motion. There are no carotid bruits on auscultation.  No abnormal tongue movements.   Chest: Clear to auscultation without wheezing, rhonchi or crackles noted.   Heart: S1+S2+0, regular and normal without murmurs, rubs or gallops noted.    Abdomen: Soft, non-tender and non-distended.   Extremities: There is no pitting edema in the distal lower extremities bilaterally.    Skin: Warm and dry without trophic changes noted.    Musculoskeletal: exam reveals no obvious joint deformities.    Neurologically:  Mental status: The patient is awake, alert and oriented  in all 4 spheres. Her immediate and remote memory, attention, language skills and fund of knowledge are appropriate. There is no evidence of aphasia, agnosia, apraxia or anomia. Speech is clear with normal prosody and enunciation. Thought process is linear. Mood is normal and affect is normal.  Cranial nerves II - XII are as described above under HEENT exam.  Motor exam: Normal bulk, strength and tone is noted. There is no obvious action or resting tremor.  No drift or rebound, no postural or intention tremor. Fine motor skills and coordination: intact grossly.  Cerebellar testing: No dysmetria or intention tremor. There is no truncal or gait ataxia.  Sensory exam: intact to light touch, temperature and vibration in the upper and lower extremities.  Gait, station and balance: She stands without difficulty. No veering to one side is noted. No leaning to one side is noted. Posture is age-appropriate and stance is narrow based. Gait shows normal stride length and normal pace. No problems turning are noted.     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  She had an orbital MRI with and without contrast, brain MRI with and without contrast and MR venogram of the head prior to her initial visit with me in May 2025.  Since then, we proceeded with a lumbar puncture in May 2025 which showed an opening pressure of 34 and a sleep study through our sleep lab in June 2025 with no obvious obstructive sleep disordered breathing.  She has been on Diamox  since May 2025.  She had a recent checkup with her ophthalmologist, Dr. Lonni Fairly and reports improvement in her papilledema.  She tolerates the Diamox  at 500 mg twice daily. She is encouraged to to work on weight loss.     Below is a summary of my recommendations and our discussion points today.  They were given these instructions and recommendations verbally during the visit and also in her MyChart after visit summary which she can access electronically as she indicated:    << 1.  If you have sudden changes in your vision especially loss of vision, please proceed to the emergency room immediately.  If you have gradual changes in your vision especially decrease in your visual field, make an appointment with your ophthalmologist as soon as possible.  Please keep all your routine follow-up appointments with Dr. Fairly.  2.  Continue to work on weight loss  3.  As you know, your vision and visual field can be affected. The most serious complication of having pseudotumor cerebri is loss of vision, which can be permanent. Work up, at least initially with the eye specialist should include: best corrected visual acuity, formal visual field testing, dilated fundus examination with optic disc photographs, and often optical coherence tomography (OCT) of the optic nerve, retinal nerve fiber layer, and macular ganglion cell layer. Worsening vision is an indication for intensifying treatment.  4.  Your sleep study showed benign findings.   5.  Continue with Diamox  500 mg twice daily.   6. Follow-up to see one of our nurse practitioners in about 6 to 8 months, sooner if needed.   >>  I answered all their questions today and the patient and her spouse were in agreement. I spent 40 minutes in total face-to-face time and in reviewing records during pre-charting, more than 50% of which was spent in counseling and coordination of care, reviewing test results, reviewing medications and treatment regimen and/or in discussing or reviewing the diagnosis of IIH, the prognosis and treatment options. Pertinent laboratory and imaging test results that were available during this visit with the patient were reviewed by me and considered in my  medical decision making (see chart for details).  40

## 2024-05-09 ENCOUNTER — Other Ambulatory Visit: Payer: Self-pay | Admitting: Neurology

## 2024-08-02 ENCOUNTER — Telehealth: Payer: Self-pay | Admitting: Neurology

## 2024-08-02 DIAGNOSIS — R519 Headache, unspecified: Secondary | ICD-10-CM

## 2024-08-02 LAB — OPHTHALMOLOGY REPORT-SCANNED

## 2024-08-02 NOTE — Telephone Encounter (Signed)
 Pt called stating that she has left eye doctor , Dr. Loreli @ [facility] and she states for Pt to follow up with neurologist to see if Neurologist can increase medication acetaZOLAMIDE  ER (DIAMOX ) 500 MG capsule  due to PT eye  exam showed  that Pt is having pressure behind her eyes . Pt would  ike to discuss this with Md first to see how MD feels about change

## 2024-08-03 ENCOUNTER — Telehealth: Payer: Self-pay | Admitting: *Deleted

## 2024-08-03 NOTE — Telephone Encounter (Signed)
 Called Dr Loreli office to request last office note. Dr Loreli need to sign off on his note before it can faxed over.

## 2024-08-06 ENCOUNTER — Telehealth: Payer: Self-pay | Admitting: Neurology

## 2024-08-06 NOTE — Telephone Encounter (Signed)
 I received optometry notes.  She saw Dr. Garrel Satchel on 08/02/2024 and I reviewed the impression/plan: IIH, VF clear but RNFL slightly thicker OD and stable OS.  Discussed not any obvious disc edema, however, if having new or worsening symptoms of headache to talk to neurologist regarding dosing as worsening ICP may show up with symptoms or weight gain before reflecting in disc edema.    She was advised to follow-up in 6 to 9 months for VF/RNFL.  I will have the full report scanned into her chart.

## 2024-08-23 MED ORDER — TOPIRAMATE 50 MG PO TABS: 50.0000 mg | ORAL_TABLET | Freq: Two times a day (BID) | ORAL | 3 refills | Status: AC

## 2024-08-23 NOTE — Telephone Encounter (Signed)
 Called pt at [phone]. LVM

## 2024-08-23 NOTE — Telephone Encounter (Signed)
 Based on her last eye exam report, I recommend maintaining the Diamox  at the current dose and not increase it.  Has she been able to talk to her PCP about weight management as weight loss can also help her IIH condition to improve over time.  Please inquire.  We can consider adding topiramate to her medications for management of headaches.  I see that she has had some medication changes in her chart, she no longer is on Latuda and lamotrigine and is she supposed to take something else in lieu of these medicines?  Please inquire.  In order for us  to start topiramate, I would recommend that she talk to her provider about reducing her trazodone first, as topiramate can be sedating as well.  Is she taking trazodone 100 mg each night?  Please update any medication changes as they may not all show up automatically in her electronic chart.  Is she on any other psychotropic medications?  I recommend that she avoid any caffeine and that she hydrate well with water, drink about 64 ounces of water per day and is she getting about 7 to 8 hours of sleep each night?  All these factors can contribute to headaches.

## 2024-08-23 NOTE — Telephone Encounter (Addendum)
 I called pt she said that she has had 5 day headaches with nausea, today getting better.  She said saw Dr. Maree with [facility] in [phone] and noted change in her optic nerves.  She has taken ibuprofen/excedrin/tylenol  as needed (is aware of Med overuse/rebound).  She has lost 12-14 lbs , then regained half of it.  She is aware of what she needs to do for weight loss. She is off lamotrigine and latuda per her psychiatrist and nothing more at this time. Trazadone 50-100mg  po prn. She has taken topiramate when much younger. I did go over her med list and is up to date. Her pharmacy is [facility]

## 2024-08-23 NOTE — Telephone Encounter (Signed)
 Dr. Buck had this note in chart:

## 2024-08-23 NOTE — Addendum Note (Signed)
 Addended by: Chanti Golubski on: 08/23/2024 04:46 PM   Modules accepted: Orders

## 2024-08-23 NOTE — Telephone Encounter (Signed)
 Pt call returning call Informed Nurse will give her a callback

## 2024-08-23 NOTE — Telephone Encounter (Signed)
 Please advise patient to start Topamax 50 mg: Take half a pill each bedtime for one week, then one pill at bedtime daily for one week, then 1/2 pill in AM and 1 at bedtime daily for one week, then 1 pill twice daily thereafter. Common side effects reported are: Sedation, sleepiness, tingling, change in taste especially with carbonated drinks, and rare side effects are glaucoma, kidney stones, and problems with thinking including word finding difficulties.

## 2024-08-23 NOTE — Telephone Encounter (Signed)
 Pt called to followup about next steps . Pt stated she has not heard back from anyone  and her eye is getting worse and making her have really bad  Headaches . Pt stated that  her nerves behind her eyes  are swelling again as well .

## 2024-08-24 NOTE — Telephone Encounter (Signed)
 I called pt and relayed instructions for her relating topiramate dosing and side effects.  She verbalized understanding. I will also send to her on mychart. She verbalized understanding.  She will call us  as needed.  Has appt in 11/2024.

## 2024-12-07 ENCOUNTER — Ambulatory Visit: Admitting: Adult Health
